# Patient Record
Sex: Female | Born: 1965 | Race: White | Hispanic: No | Smoking: Never smoker
Health system: Southern US, Community
[De-identification: ages and names within clinical notes are randomized; demographics above are authoritative.]

## PROBLEM LIST (undated history)

## (undated) DIAGNOSIS — F32A Depression, unspecified: Secondary | ICD-10-CM

## (undated) DIAGNOSIS — T7840XA Allergy, unspecified, initial encounter: Secondary | ICD-10-CM

## (undated) DIAGNOSIS — F419 Anxiety disorder, unspecified: Secondary | ICD-10-CM

## (undated) DIAGNOSIS — F329 Major depressive disorder, single episode, unspecified: Secondary | ICD-10-CM

## (undated) HISTORY — DX: Depression, unspecified: F32.A

## (undated) HISTORY — DX: Anxiety disorder, unspecified: F41.9

## (undated) HISTORY — PX: APPENDECTOMY: SHX54

## (undated) HISTORY — DX: Allergy, unspecified, initial encounter: T78.40XA

## (undated) HISTORY — DX: Major depressive disorder, single episode, unspecified: F32.9

---

## 2002-03-22 ENCOUNTER — Other Ambulatory Visit: Admission: RE | Admit: 2002-03-22 | Discharge: 2002-03-22 | Payer: Self-pay | Admitting: Internal Medicine

## 2004-07-29 ENCOUNTER — Encounter: Payer: Self-pay | Admitting: Internal Medicine

## 2004-07-29 LAB — CONVERTED CEMR LAB

## 2007-04-24 ENCOUNTER — Ambulatory Visit: Payer: Self-pay | Admitting: Internal Medicine

## 2007-04-24 LAB — CONVERTED CEMR LAB
ALT: 13 units/L (ref 0–35)
AST: 18 units/L (ref 0–37)
Albumin: 4 g/dL (ref 3.5–5.2)
BUN: 14 mg/dL (ref 6–23)
Basophils Relative: 0.1 % (ref 0.0–1.0)
Bilirubin Urine: NEGATIVE
Crystals: NEGATIVE
Eosinophils Absolute: 0.2 10*3/uL (ref 0.0–0.6)
HCT: 36 % (ref 36.0–46.0)
HDL: 52.2 mg/dL (ref >39.0)
Hemoglobin, Urine: NEGATIVE
Hemoglobin: 12.3 g/dL (ref 12.0–15.0)
Neutro Abs: 3.5 10*3/uL (ref 1.4–7.7)
Nitrite: POSITIVE — AB
Platelets: 214 10*3/uL (ref 150–400)
Potassium: 4.5 meq/L (ref 3.5–5.1)
RDW: 13.2 % (ref 11.5–14.6)
Specific Gravity, Urine: 1.02 (ref 1.000–1.03)
TSH: 0.99 microintl units/mL (ref 0.35–5.50)
Total Bilirubin: 1 mg/dL (ref 0.3–1.2)
Total Protein: 7 g/dL (ref 6.0–8.3)
Triglycerides: 59 mg/dL (ref 0–149)

## 2007-05-01 ENCOUNTER — Encounter: Admission: RE | Admit: 2007-05-01 | Discharge: 2007-05-01 | Payer: Self-pay | Admitting: Internal Medicine

## 2007-05-01 ENCOUNTER — Encounter: Payer: Self-pay | Admitting: Internal Medicine

## 2007-05-01 DIAGNOSIS — J309 Allergic rhinitis, unspecified: Secondary | ICD-10-CM | POA: Insufficient documentation

## 2007-05-01 DIAGNOSIS — F329 Major depressive disorder, single episode, unspecified: Secondary | ICD-10-CM | POA: Insufficient documentation

## 2007-05-01 DIAGNOSIS — A6000 Herpesviral infection of urogenital system, unspecified: Secondary | ICD-10-CM | POA: Insufficient documentation

## 2007-05-01 DIAGNOSIS — Z87898 Personal history of other specified conditions: Secondary | ICD-10-CM | POA: Insufficient documentation

## 2007-05-01 DIAGNOSIS — Z9189 Other specified personal risk factors, not elsewhere classified: Secondary | ICD-10-CM | POA: Insufficient documentation

## 2007-05-01 DIAGNOSIS — F411 Generalized anxiety disorder: Secondary | ICD-10-CM | POA: Insufficient documentation

## 2007-05-01 DIAGNOSIS — F3289 Other specified depressive episodes: Secondary | ICD-10-CM | POA: Insufficient documentation

## 2007-05-01 DIAGNOSIS — J3089 Other allergic rhinitis: Secondary | ICD-10-CM | POA: Insufficient documentation

## 2008-01-19 ENCOUNTER — Encounter: Admission: RE | Admit: 2008-01-19 | Discharge: 2008-01-19 | Payer: Self-pay | Admitting: Family Medicine

## 2008-05-11 IMAGING — US US EXTREM LOW NON VASC*L*
1 series · 14 of 19 positions shown · non-contrast
Comparison: none

CLINICAL DATA: Left posterior knee swelling.
 ULTRASOUND OF LEFT LOWER EXTREMITY:

[Series 1: us extrem low non vasc*left* · 0.08mm/px · 14 of 19 slices shown]
[im 1/19]
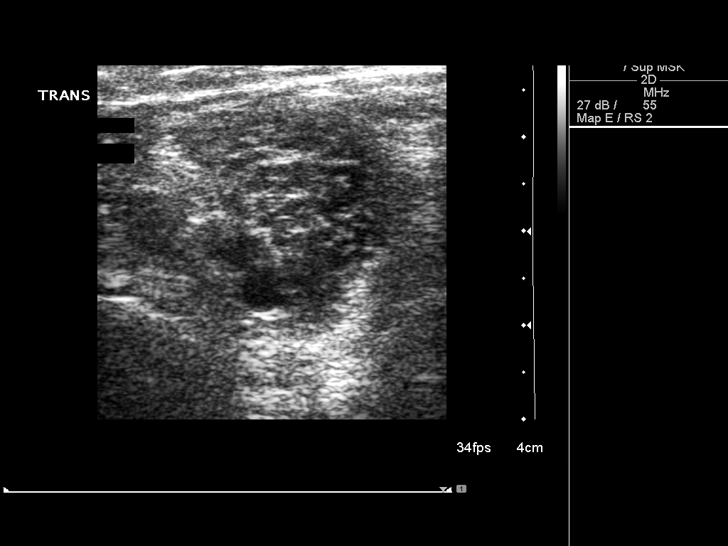
[im 3/19]
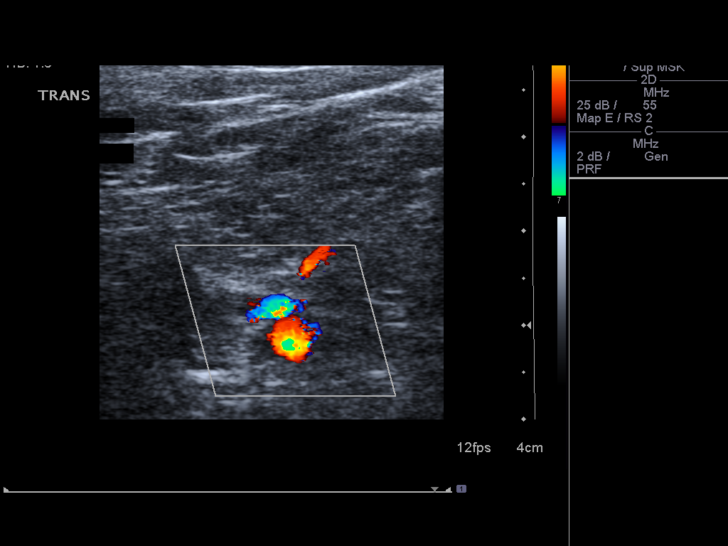
[im 4/19]
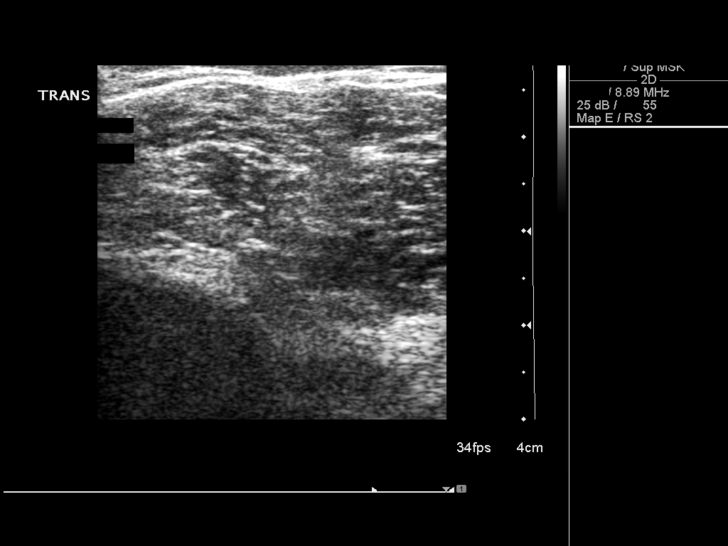
[im 5/19]
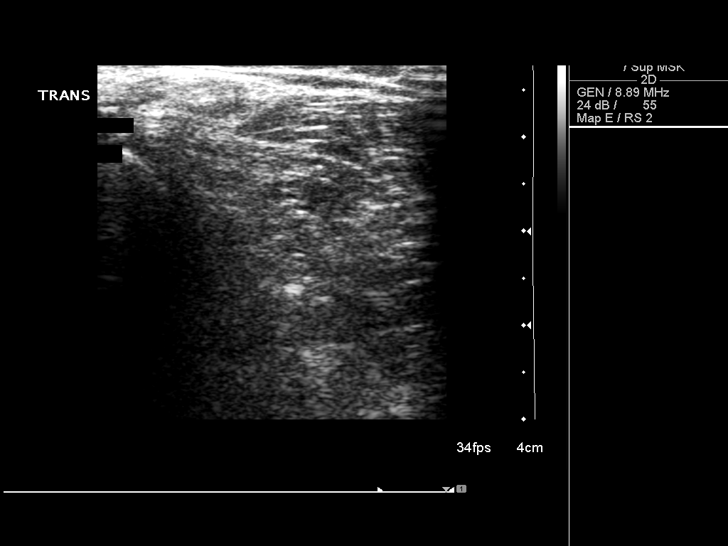
[im 7/19]
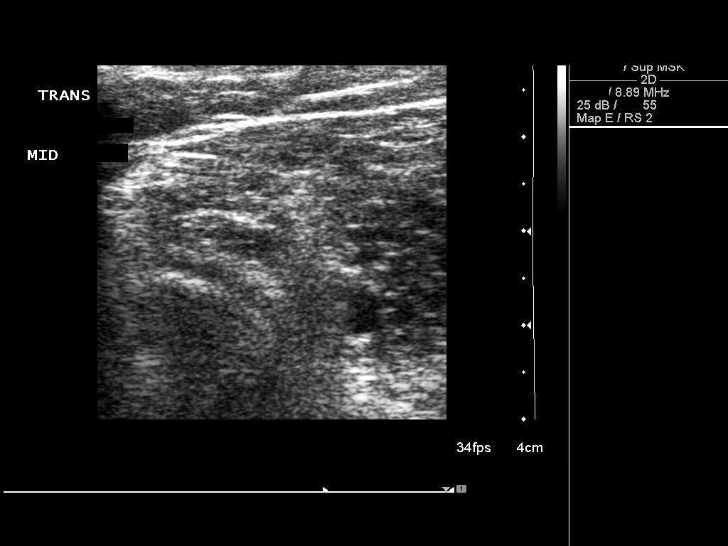
[im 8/19]
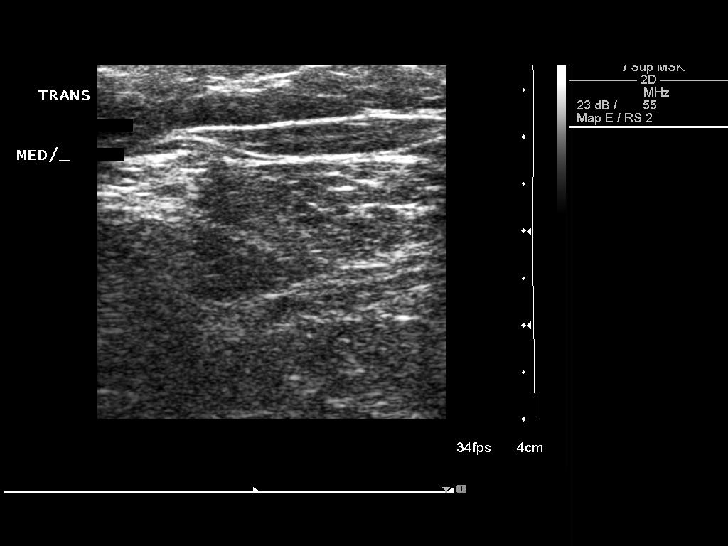
[im 9/19]
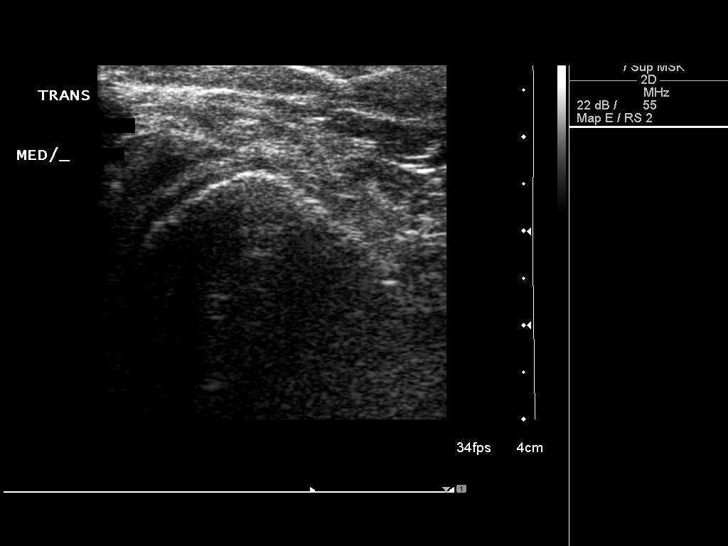
[im 11/19]
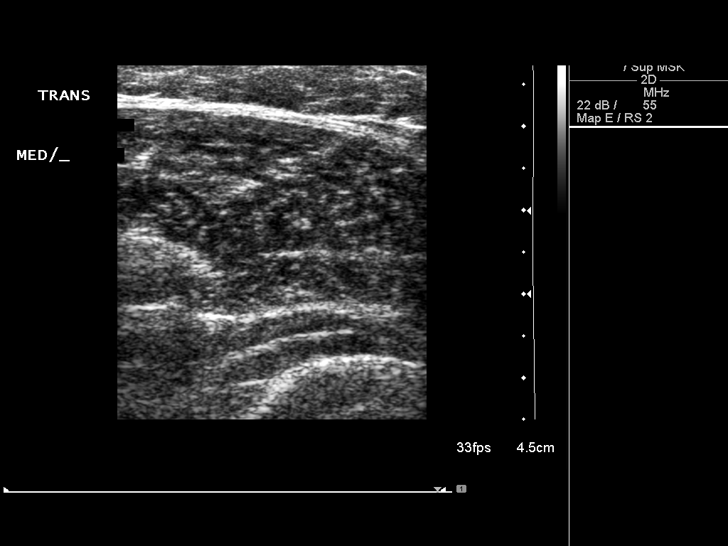
[im 12/19]
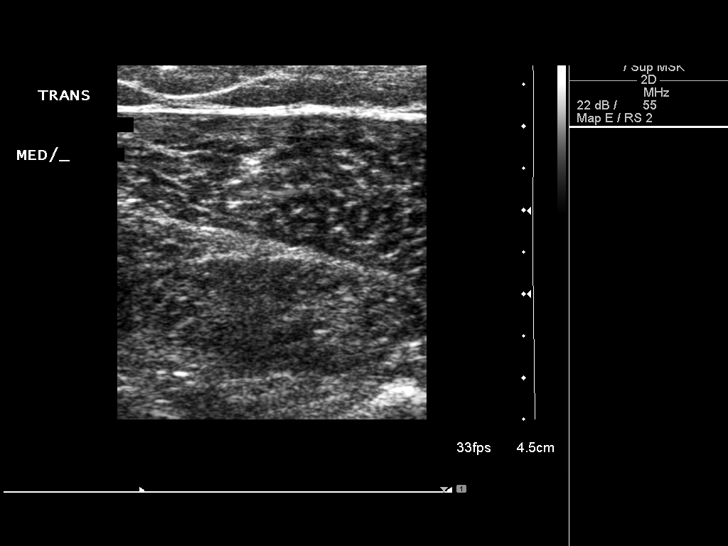
[im 13/19]
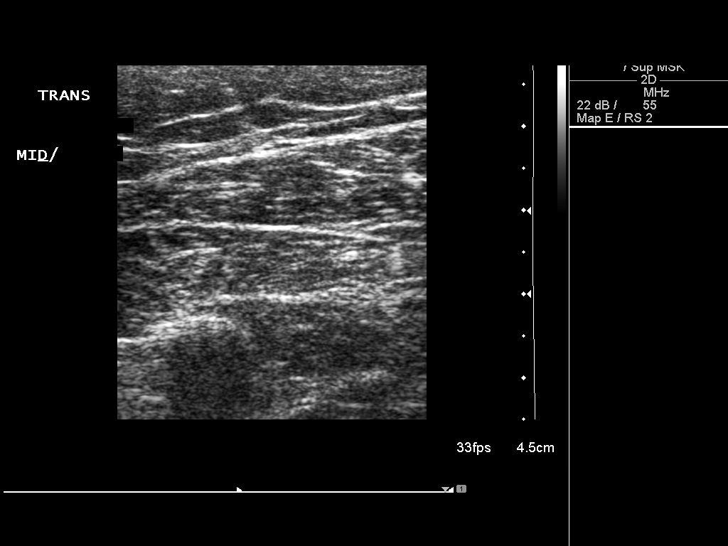
[im 15/19]
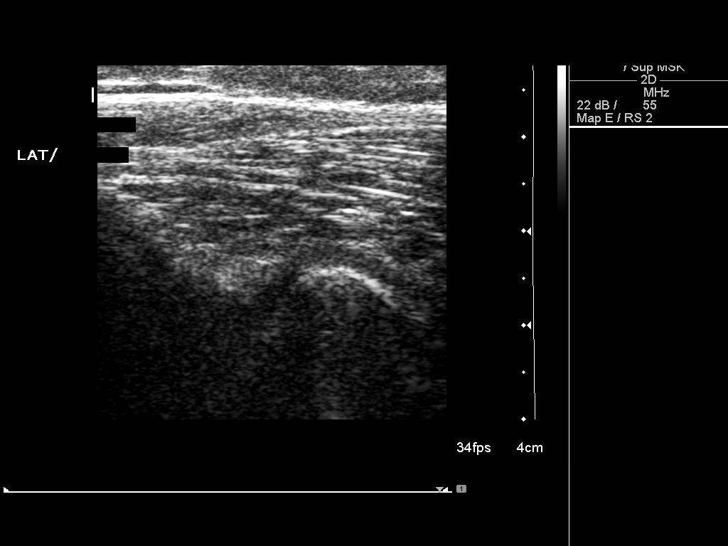
[im 16/19]
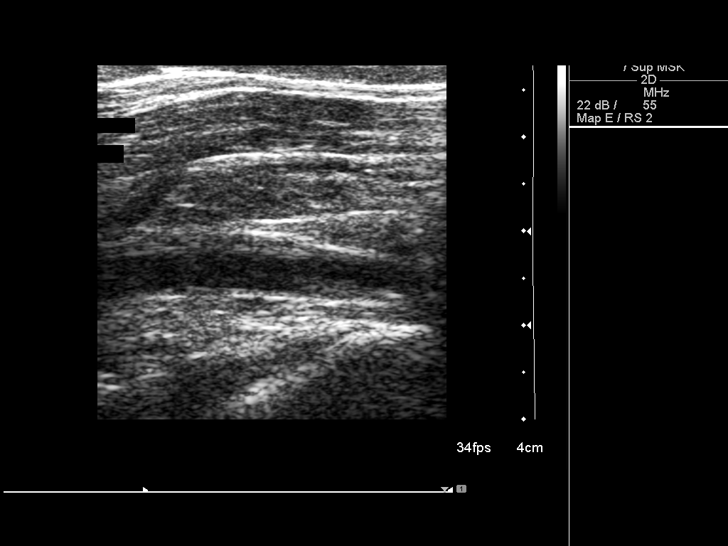
[im 17/19]
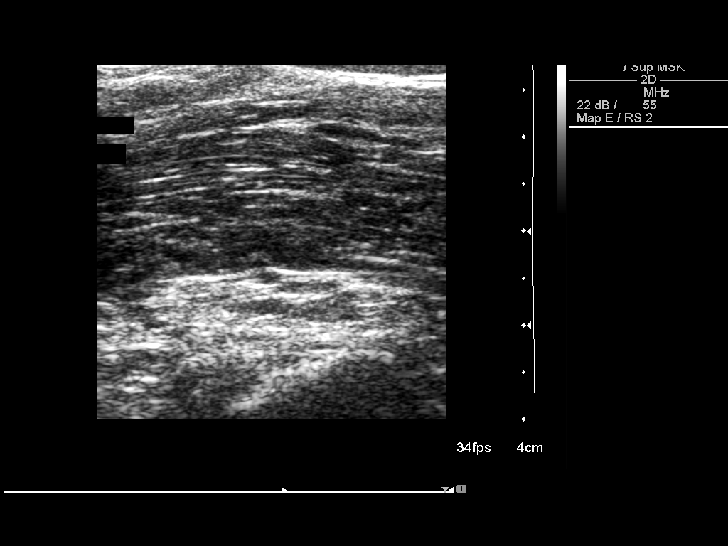
[im 19/19]
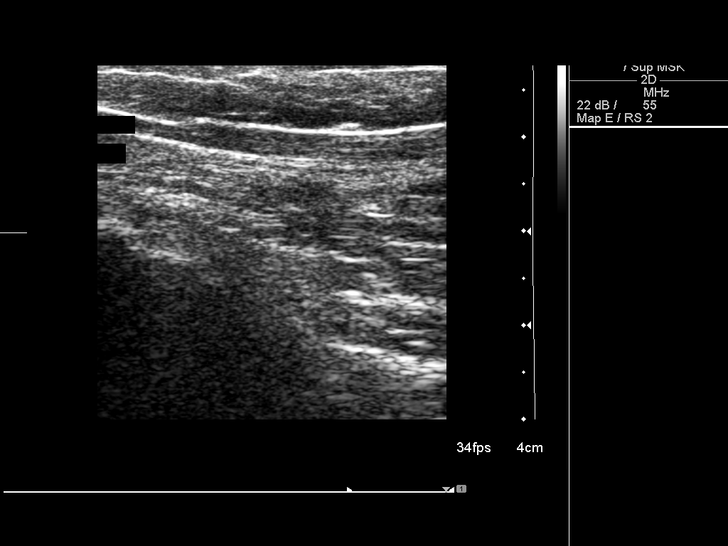

[14 of 19 positions shown; findings below may reference images not displayed]

FINDINGS: No solid or cystic lesion is seen.
IMPRESSION: Negative ultrasound of the left knee for popliteal cyst.

## 2012-05-05 ENCOUNTER — Other Ambulatory Visit: Payer: Self-pay | Admitting: Family Medicine

## 2012-05-05 DIAGNOSIS — Z1231 Encounter for screening mammogram for malignant neoplasm of breast: Secondary | ICD-10-CM

## 2012-06-02 ENCOUNTER — Ambulatory Visit
Admission: RE | Admit: 2012-06-02 | Discharge: 2012-06-02 | Disposition: A | Discharge status: Home / Self Care | Payer: Federal, State, Local not specified - PPO | Source: Ambulatory Visit | Attending: Family Medicine | Admitting: Family Medicine

## 2012-06-02 DIAGNOSIS — Z1231 Encounter for screening mammogram for malignant neoplasm of breast: Secondary | ICD-10-CM

## 2014-01-20 ENCOUNTER — Other Ambulatory Visit: Payer: Self-pay | Admitting: Family Medicine

## 2014-01-20 DIAGNOSIS — Z1231 Encounter for screening mammogram for malignant neoplasm of breast: Secondary | ICD-10-CM

## 2014-02-03 ENCOUNTER — Ambulatory Visit
Admission: RE | Admit: 2014-02-03 | Discharge: 2014-02-03 | Disposition: A | Discharge status: Home / Self Care | Payer: Federal, State, Local not specified - PPO | Source: Ambulatory Visit | Attending: Family Medicine | Admitting: Family Medicine

## 2014-02-03 ENCOUNTER — Encounter (INDEPENDENT_AMBULATORY_CARE_PROVIDER_SITE_OTHER): Payer: Self-pay

## 2014-02-03 DIAGNOSIS — Z1231 Encounter for screening mammogram for malignant neoplasm of breast: Secondary | ICD-10-CM

## 2014-02-04 ENCOUNTER — Other Ambulatory Visit: Payer: Self-pay | Admitting: Family Medicine

## 2014-02-04 DIAGNOSIS — R928 Other abnormal and inconclusive findings on diagnostic imaging of breast: Secondary | ICD-10-CM

## 2014-02-08 ENCOUNTER — Ambulatory Visit (INDEPENDENT_AMBULATORY_CARE_PROVIDER_SITE_OTHER): Payer: Federal, State, Local not specified - PPO | Admitting: Licensed Clinical Social Worker

## 2014-02-08 DIAGNOSIS — F331 Major depressive disorder, recurrent, moderate: Secondary | ICD-10-CM

## 2014-02-17 ENCOUNTER — Ambulatory Visit
Admission: RE | Admit: 2014-02-17 | Discharge: 2014-02-17 | Disposition: A | Discharge status: Home / Self Care | Payer: Federal, State, Local not specified - PPO | Source: Ambulatory Visit | Attending: Family Medicine | Admitting: Family Medicine

## 2014-02-17 ENCOUNTER — Ambulatory Visit (INDEPENDENT_AMBULATORY_CARE_PROVIDER_SITE_OTHER): Payer: Federal, State, Local not specified - PPO | Admitting: Licensed Clinical Social Worker

## 2014-02-17 DIAGNOSIS — R928 Other abnormal and inconclusive findings on diagnostic imaging of breast: Secondary | ICD-10-CM

## 2014-02-17 DIAGNOSIS — F331 Major depressive disorder, recurrent, moderate: Secondary | ICD-10-CM

## 2014-03-03 ENCOUNTER — Ambulatory Visit (INDEPENDENT_AMBULATORY_CARE_PROVIDER_SITE_OTHER): Payer: Federal, State, Local not specified - PPO | Admitting: Licensed Clinical Social Worker

## 2014-03-03 DIAGNOSIS — F331 Major depressive disorder, recurrent, moderate: Secondary | ICD-10-CM

## 2014-04-28 ENCOUNTER — Ambulatory Visit (INDEPENDENT_AMBULATORY_CARE_PROVIDER_SITE_OTHER): Payer: Federal, State, Local not specified - PPO | Admitting: Family Medicine

## 2014-04-28 VITALS — BP 112/60 | HR 62 | Temp 97.6°F | Resp 18

## 2014-04-28 DIAGNOSIS — T63441A Toxic effect of venom of bees, accidental (unintentional), initial encounter: Secondary | ICD-10-CM

## 2014-04-28 DIAGNOSIS — M7989 Other specified soft tissue disorders: Secondary | ICD-10-CM

## 2014-04-28 MED ORDER — METHYLPREDNISOLONE SODIUM SUCC 125 MG IJ SOLR
125.0000 mg | Freq: Once | INTRAMUSCULAR | Status: AC
  Administered 2014-04-28: 125 mg via INTRAMUSCULAR

## 2014-04-28 MED ORDER — EPIPEN 2-PAK 0.3 MG/0.3ML IJ SOAJ: INTRAMUSCULAR | Status: AC

## 2014-04-28 MED ORDER — AMOXICILLIN-POT CLAVULANATE 875-125 MG PO TABS
1.0000 | ORAL_TABLET | Freq: Two times a day (BID) | ORAL | Status: DC
Start: 2014-04-28 — End: 2021-12-12

## 2014-04-28 MED ORDER — PREDNISONE 20 MG PO TABS: ORAL_TABLET | ORAL | Status: DC

## 2014-04-28 NOTE — Progress Notes (Signed)
Subjective: Seen emergently Patient was moving some stuff outdoors yesterday got stung on the right hand by a yellow jacket. She never has had excessive laxatives in the past, but she swelled up. She applied ice. There is erythema to the elbow. She took Zyrtec and took it this morning again. She came on in here because the hand was slightly swollen. No respiratory compromise.  Objective: Severe local reaction to bee sting that was the lower aspect right wrist. The hand is slightly swollen with induration palpable up to just above the elbow. There is some erythema in the antecubital fossa as well as areas of the hand. It does not feel extremely warm to touch though it does feel slightly warm to touch. No axillary nodes. Chest clear. Heart regular.  Assessment: Severe local reaction to bee sting (probably yellow jacket)  Plan: Solu-Medrol 125 Prednisone taper Ice Antihistamine with Zyrtec Due to the erythema decided to cover with antibiotic EpiPen Return if worse

## 2014-04-28 NOTE — Patient Instructions (Addendum)
Take Zyrtec for allergy and itching. Usual doses one pill daily, however I would take a second one tonight and then decrease to the one daily  Take the prednisone beginning tomorrow 3 pills daily for 2 days, then 2 daily for 2 days, then one daily for 2 days. If remarkably better can discontinue after a couple of days  Carry an EpiPen in case of emergency as directed. Read the instructions while you are healthy.  Return if not improving  Augmentin one twice daily for 5 days for possible infection  Bee, Wasp, or Hornet Sting Your caregiver has diagnosed you as having an insect sting. An insect sting appears as a red lump in the skin that sometimes has a tiny hole in the center, or it may have a stinger in the center of the wound. The most common stings are from wasps, hornets and bees. Individuals have different reactions to insect stings.  A normal reaction may cause pain, swelling, and redness around the sting site.  A localized allergic reaction may cause swelling and redness that extends beyond the sting site.  A large local reaction may continue to develop over the next 12 to 36 hours.  On occasion, the reactions can be severe (anaphylactic reaction). An anaphylactic reaction may cause wheezing; difficulty breathing; chest pain; fainting; raised, itchy, red patches on the skin; a sick feeling to your stomach (nausea); vomiting; cramping; or diarrhea. If you have had an anaphylactic reaction to an insect sting in the past, you are more likely to have one again. HOME CARE INSTRUCTIONS   With bee stings, a small sac of poison is left in the wound. Brushing across this with something such as a credit card, or anything similar, will help remove this and decrease the amount of the reaction. This same procedure will not help a wasp sting as they do not leave behind a stinger and poison sac.  Apply a cold compress for 10 to 20 minutes every hour for 1 to 2 days, depending on severity, to reduce  swelling and itching.  To lessen pain, a paste made of water and baking soda may be rubbed on the bite or sting and left on for 5 minutes.  To relieve itching and swelling, you may use take medication or apply medicated creams or lotions as directed.  Only take over-the-counter or prescription medicines for pain, discomfort, or fever as directed by your caregiver.  Wash the sting site daily with soap and water. Apply antibiotic ointment on the sting site as directed.  If you suffered a severe reaction:  If you did not require hospitalization, an adult will need to stay with you for 24 hours in case the symptoms return.  You may need to wear a medical bracelet or necklace stating the allergy.  You and your family need to learn when and how to use an anaphylaxis kit or epinephrine injection.  If you have had a severe reaction before, always carry your anaphylaxis kit with you. SEEK MEDICAL CARE IF:   None of the above helps within 2 to 3 days.  The area becomes red, warm, tender, and swollen beyond the area of the bite or sting.  You have an oral temperature above 102 F (38.9 C). SEEK IMMEDIATE MEDICAL CARE IF:  You have symptoms of an allergic reaction which are:  Wheezing.  Difficulty breathing.  Chest pain.  Lightheadedness or fainting.  Itchy, raised, red patches on the skin.  Nausea, vomiting, cramping or diarrhea. ANY OF  THESE SYMPTOMS MAY REPRESENT A SERIOUS PROBLEM THAT IS AN EMERGENCY. Do not wait to see if the symptoms will go away. Get medical help right away. Call your local emergency services (911 in [place] DO NOT drive yourself to the hospital. MAKE SURE YOU:   Understand these instructions.  Will watch your condition.  Will get help right away if you are not doing well or get worse. Document Released: 07/15/2005 Document Revised: 10/07/2011 Document Reviewed: 12/30/2009 [facility] Patient Information 2015 [place], [place]. This information is not intended  to replace advice given to you by your health care provider. Make sure you discuss any questions you have with your health care provider.

## 2020-12-24 LAB — COLOGUARD: COLOGUARD: NEGATIVE

## 2021-01-10 LAB — HM PAP SMEAR

## 2021-11-30 ENCOUNTER — Telehealth: Payer: Self-pay

## 2021-11-30 NOTE — Telephone Encounter (Signed)
Patient called in and states she use to see Dr. Mardelle Matte a few years ago and moved out of town. Patient would like to see if she would  Dr. Mardelle Matte would be able to take her on as a new patient.  ?

## 2021-12-12 ENCOUNTER — Encounter: Payer: Self-pay | Admitting: Family Medicine

## 2021-12-12 ENCOUNTER — Ambulatory Visit (INDEPENDENT_AMBULATORY_CARE_PROVIDER_SITE_OTHER): Payer: 59 | Admitting: Family Medicine

## 2021-12-12 VITALS — BP 100/64 | HR 63 | Temp 97.9°F | Ht 64.0 in | Wt 135.2 lb

## 2021-12-12 DIAGNOSIS — F411 Generalized anxiety disorder: Secondary | ICD-10-CM

## 2021-12-12 DIAGNOSIS — A6000 Herpesviral infection of urogenital system, unspecified: Secondary | ICD-10-CM | POA: Diagnosis not present

## 2021-12-12 DIAGNOSIS — Z0000 Encounter for general adult medical examination without abnormal findings: Secondary | ICD-10-CM | POA: Diagnosis not present

## 2021-12-12 DIAGNOSIS — R69 Illness, unspecified: Secondary | ICD-10-CM | POA: Diagnosis not present

## 2021-12-12 DIAGNOSIS — F1090 Alcohol use, unspecified, uncomplicated: Secondary | ICD-10-CM

## 2021-12-12 DIAGNOSIS — Z1231 Encounter for screening mammogram for malignant neoplasm of breast: Secondary | ICD-10-CM

## 2021-12-12 DIAGNOSIS — Z1159 Encounter for screening for other viral diseases: Secondary | ICD-10-CM | POA: Diagnosis not present

## 2021-12-12 DIAGNOSIS — Z23 Encounter for immunization: Secondary | ICD-10-CM | POA: Diagnosis not present

## 2021-12-12 DIAGNOSIS — T63441A Toxic effect of venom of bees, accidental (unintentional), initial encounter: Secondary | ICD-10-CM | POA: Insufficient documentation

## 2021-12-12 DIAGNOSIS — J3089 Other allergic rhinitis: Secondary | ICD-10-CM | POA: Diagnosis not present

## 2021-12-12 HISTORY — DX: Alcohol use, unspecified, uncomplicated: F10.90

## 2021-12-12 LAB — LIPID PANEL
Cholesterol: 152 mg/dL (ref 0–200)
HDL: 53.4 mg/dL (ref >39.00)
LDL Cholesterol: 81 mg/dL (ref 0–99)
NonHDL: 98.37
Total CHOL/HDL Ratio: 3
Triglycerides: 88 mg/dL (ref 0.0–149.0)
VLDL: 17.6 mg/dL (ref 0.0–40.0)

## 2021-12-12 LAB — CBC WITH DIFFERENTIAL/PLATELET
Basophils Absolute: 0 10*3/uL (ref 0.0–0.1)
Basophils Relative: 0.8 % (ref 0.0–3.0)
Eosinophils Absolute: 0 10*3/uL (ref 0.0–0.7)
Eosinophils Relative: 1 % (ref 0.0–5.0)
HCT: 39.6 % (ref 36.0–46.0)
Hemoglobin: 12.9 g/dL (ref 12.0–15.0)
Lymphocytes Relative: 24.7 % (ref 12.0–46.0)
Lymphs Abs: 1.1 10*3/uL (ref 0.7–4.0)
MCHC: 32.7 g/dL (ref 30.0–36.0)
MCV: 93.6 fl (ref 78.0–100.0)
Monocytes Absolute: 0.3 10*3/uL (ref 0.1–1.0)
Monocytes Relative: 7.2 % (ref 3.0–12.0)
Neutro Abs: 2.9 10*3/uL (ref 1.4–7.7)
Neutrophils Relative %: 66.3 % (ref 43.0–77.0)
Platelets: 183 10*3/uL (ref 150.0–400.0)
RBC: 4.23 Mil/uL (ref 3.87–5.11)
RDW: 14.5 % (ref 11.5–15.5)
WBC: 4.4 10*3/uL (ref 4.0–10.5)

## 2021-12-12 LAB — COMPREHENSIVE METABOLIC PANEL
ALT: 18 U/L (ref 0–35)
AST: 20 U/L (ref 0–37)
Albumin: 4.3 g/dL (ref 3.5–5.2)
Alkaline Phosphatase: 45 U/L (ref 39–117)
BUN: 14 mg/dL (ref 6–23)
CO2: 23 mEq/L (ref 19–32)
Calcium: 9.2 mg/dL (ref 8.4–10.5)
Chloride: 105 mEq/L (ref 96–112)
Creatinine, Ser: 0.79 mg/dL (ref 0.40–1.20)
GFR: 83.22 mL/min (ref >60.00)
Glucose, Bld: 77 mg/dL (ref 70–99)
Potassium: 4.1 mEq/L (ref 3.5–5.1)
Sodium: 138 mEq/L (ref 135–145)
Total Bilirubin: 0.4 mg/dL (ref 0.2–1.2)
Total Protein: 6.9 g/dL (ref 6.0–8.3)

## 2021-12-12 MED ORDER — VALACYCLOVIR HCL 1 G PO TABS: 1000.0000 mg | ORAL_TABLET | Freq: Every day | ORAL | 3 refills | Status: DC

## 2021-12-12 NOTE — Progress Notes (Signed)
? ?Subjective  ?Chief Complaint  ?Patient presents with  ? Annual Exam  ?  New pt here for CPE  ? ? ?HPI: Isabella Barrett is a 56 y.o. female who presents to [facility] [place] today for a Female Wellness Visit. She also has the concerns and/or needs as listed above in the chief complaint. These will be addressed in addition to the Health Maintenance Visit.  ? ?Wellness Visit: annual visit with health maintenance review and exam without Pap ? ?Pleasant 56 year old divorced single female, mother of 3 grown children returns to [facility] from self for about a month ago.  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  Overall doing well.  Would like to establish care and have annual physical today.  Reviewed old records.  Mammogram, Pap smear and Cologuard screens are all current and up-to-date.  She is eligible for Shingrix. ?Chronic disease f/u and/or acute problem visit: (deemed necessary to be done in addition to the wellness visit): ?Has chronic anxiety that is well controlled on Prozac 40 mg daily.  She has been on this for a number of years.  No adverse effects.  However she does admit to daily alcohol use.  She drinks 1-2 drinks per night to calm her nerves.  No dependency issues or functional issues.  No complications from alcohol that she is aware of ?Genital herpes on Famvir twice daily for prevention.  This was started 3 years ago due to recurrent outbreaks. ? ?Assessment  ?1. Annual physical exam   ?2. GAD (generalized anxiety disorder)   ?3. Perennial allergic rhinitis   ?4. Need for hepatitis C screening test   ?5. Genital herpes simplex, unspecified site   ?6. Encounter for screening mammogram for breast cancer   ?7. Need for shingles vaccine   ?8. Habitual alcohol use   ? ?  ?Plan  ?Female Wellness Visit: ?Age appropriate Health Maintenance and Prevention measures were discussed with patient. Included topics are cancer screening recommendations, ways to keep healthy (see  AVS) including dietary and exercise recommendations, regular eye and dental care, use of seat belts, and avoidance of moderate alcohol use and tobacco use.  Screens are current ?BMI: discussed patient's BMI and encouraged positive lifestyle modifications to help get to or maintain a target BMI. ?HM needs and immunizations were addressed and ordered. See below for orders. See HM and immunization section for updates.  Vaccination counseling given for Shingrix.  First dose given today.  Return in 2 to 6 months for second dose ?Routine labs and screening tests ordered including cmp, cbc and lipids where appropriate. ?Discussed recommendations regarding Vit D and calcium supplementation (see AVS) ? ?Chronic disease management visit and/or acute problem visit: ?Chronic alcohol use: Counseling done.  Recommend decreasing use. ?Anxiety disorder: Continue Prozac 40. ?Recurrent genital herpes prophylaxis: Changed to Valtrex 1000 mg daily. ? ?Follow up: 12 months for complete physical ?Orders Placed This Encounter  ?Procedures  ? MM DIGITAL SCREENING BILATERAL  ? Varicella-zoster vaccine IM (Shingrix)  ? CBC with Differential/Platelet  ? Comp Met (CMET)  ? Lipid Profile  ? Hepatitis C Antibody  ? ?Meds ordered this encounter  ?Medications  ? valACYclovir (VALTREX) 1000 MG tablet  ?  Sig: Take 1 tablet (1,000 mg total) by mouth daily.  ?  Dispense:  90 tablet  ?  Refill:  3  ? ?  ? ?Body mass index is 23.21 kg/m?. ?Wt Readings from Last 3 Encounters:  ?12/12/21 135 lb 3.2 oz (  61.3 kg)  ?04/24/07 127 lb (57.6 kg)  ? ? ? ?Patient Active Problem List  ? Diagnosis Date Noted  ? Anaphylactic reaction to bee sting 12/12/2021  ?  Epi pen ? ?  ? Habitual alcohol use 12/12/2021  ?  Daily, 2 drinks per night.  ? ?  ? GAD (generalized anxiety disorder) 05/01/2007  ?  prozac ? ?  ? Perennial allergic rhinitis 05/01/2007  ? Genital herpes 05/01/2007  ? ?Health Maintenance  ?Topic Date Due  ? Hepatitis C Screening  Never done  ? COVID-19  Vaccine (3 - Pfizer risk series) 12/28/2021 (Originally 12/30/2019)  ? MAMMOGRAM  01/30/2022  ? Zoster Vaccines- Shingrix (2 of 2) 02/06/2022  ? INFLUENZA VACCINE  02/26/2022  ? TETANUS/TDAP  01/21/2024  ? PAP SMEAR-Modifier  01/08/2026  ? HIV Screening  Completed  ? HPV VACCINES  Aged Out  ? Fecal DNA (Cologuard)  Discontinued  ? ?Immunization History  ?Administered Date(s) Administered  ? Influenza, Seasonal, Injecte, Preservative Fre 04/22/2014  ? PFIZER(Purple Top)SARS-COV-2 Vaccination 11/11/2019, 12/02/2019  ? Tdap 01/20/2014  ? Zoster Recombinat (Shingrix) 12/12/2021  ? ?We updated and reviewed the patient's past history in detail and it is documented below. ?Allergies: ?Patient is allergic to bee venom, sulfa antibiotics, and sulfonamide derivatives. ?Past Medical History ?Patient  has a past medical history of Allergy, Anxiety, and Depression. ?Past Surgical History ?Patient  has a past surgical history that includes Appendectomy. ?Family History: ?Patient family history includes Cancer in her father. ?Social History:  ?Patient  reports that she has never smoked. She does not have any smokeless tobacco history on file. She reports that she does not use drugs. ? ?Review of Systems: ?Constitutional: negative for fever or malaise ?Ophthalmic: negative for photophobia, double vision or loss of vision ?Cardiovascular: negative for chest pain, dyspnea on exertion, or new LE swelling ?Respiratory: negative for SOB or persistent cough ?Gastrointestinal: negative for abdominal pain, change in bowel habits or melena ?Genitourinary: negative for dysuria or gross hematuria, no abnormal uterine bleeding or disharge ?Musculoskeletal: negative for new gait disturbance or muscular weakness ?Integumentary: negative for new or persistent rashes, no breast lumps ?Neurological: negative for TIA or stroke symptoms ?Psychiatric: negative for SI or delusions ?Allergic/Immunologic: negative for hives ? ?Patient Care Team  ?   Relationship Specialty Notifications Start End  ?Leamon Arnt, MD PCP - General Family Medicine  12/12/21   ? ? ?Objective  ?Vitals: BP 100/64   Pulse 63   Temp 97.9 ?F (36.6 ?C)   Ht 5' 4"  (1.626 m)   Wt 135 lb 3.2 oz (61.3 kg)   SpO2 95%   BMI 23.21 kg/m?  ?General:  Well developed, well nourished, no acute distress  ?Psych:  Alert and orientedx3,normal mood and affect ?HEENT:  Normocephalic, atraumatic, non-icteric sclera,  supple neck without adenopathy, mass or thyromegaly ?Cardiovascular:  Normal S1, S2, RRR without gallop, rub or murmur ?Respiratory:  Good breath sounds bilaterally, CTAB with normal respiratory effort ?Gastrointestinal: normal bowel sounds, soft, non-tender, no noted masses. No HSM ?MSK: no deformities, contusions. Joints are without erythema or swelling.  ?Skin:  Warm, no rashes or suspicious lesions noted ?Neurologic:    Mental status is normal. CN 2-11 are normal. Gross motor and sensory exams are normal. Normal gait. No tremor ? ? ?Commons side effects, risks, benefits, and alternatives for medications and treatment plan prescribed today were discussed, and the patient expressed understanding of the given instructions. Patient is instructed to call or message via MyChart  if he/she has any questions or concerns regarding our treatment plan. No barriers to understanding were identified. We discussed Red Flag symptoms and signs in detail. Patient expressed understanding regarding what to do in case of urgent or emergency type symptoms.  ?Medication list was reconciled, printed and provided to the patient in AVS. Patient instructions and summary information was reviewed with the patient as documented in the AVS. ?This note was prepared with assistance of Systems analyst. Occasional wrong-word or sound-a-like substitutions may have occurred due to the inherent limitations of voice recognition software ? ?This visit occurred during the SARS-CoV-2 public health  emergency.  Safety protocols were in place, including screening questions prior to the visit, additional usage of staff PPE, and extensive cleaning of exam room while observing appropriate contact time as indicated for disinf

## 2021-12-12 NOTE — Patient Instructions (Addendum)
It was so good seeing you again! Thank you for establishing with my new practice and allowing me to continue caring for you. It means a lot to me.  ? ?Please schedule a follow up appointment with me in 12 months for your annual complete physical; please come fasting. Please schedule a nurse visit in 2-6 months to receive your 2nd shingrix vaccination. You were given your first today. ? ?I will release your lab results to you on your MyChart account with further instructions. You may see the results before I do, but when I review them I will send you a message with my report or have my assistant call you if things need to be discussed. Please reply to my message with any questions. Thank you!  ? ?I have ordered a mammogram and/or bone density for you as we discussed today: this is due in July 2023 ?[x]   Mammogram  ?[]   Bone Density ? ?Please call the office checked below to schedule your appointment: ? ?[x]   [facility]     ? [address]      ? [place],       ? [phone]        ? ?[]   [facility] ? [address] [place]  ? [place], [facility] ? [phone] ? ?Please do these things to maintain good health! ? ?Exercise at least 30-45 minutes a day,  4-5 days a week.  ?Eat a low-fat diet with lots of fruits and vegetables, up to 7-9 servings per day. ?Drink plenty of water daily. Try to drink 8 8oz glasses per day. ?Seatbelts can save your life. Always wear your seatbelt. ?Place Smoke Detectors on every level of your home and check batteries every year. ?Schedule an appointment with an eye doctor for an eye exam every 1-2 years ?Safe sex - use condoms to protect yourself from STDs if you could be exposed to these types of infections. Use birth control if you do not want to become pregnant and are sexually active. ?Avoid heavy alcohol use. If you drink, keep it to less than 2 drinks/day and not every day. ?Health Care Power of Attorney.  Choose someone you trust that could  speak for you if you became unable to speak for yourself. ?Depression is common in our stressful world.If you're feeling down or losing interest in things you normally enjoy, please come in for a visit. ?If anyone is threatening or hurting you, please get help. Physical or Emotional Violence is never OK.   ?  ? ?

## 2022-02-14 ENCOUNTER — Ambulatory Visit (INDEPENDENT_AMBULATORY_CARE_PROVIDER_SITE_OTHER): Payer: 59 | Admitting: *Deleted

## 2022-02-14 DIAGNOSIS — Z23 Encounter for immunization: Secondary | ICD-10-CM | POA: Diagnosis not present

## 2022-02-14 NOTE — Progress Notes (Signed)
Per orders of Dr. Mardelle Matte, injection of 2nd Shingles Vaccine given in left deltoid per patient preference by Jobe Gibbon, CMA. Patient tolerated injection well.

## 2022-03-14 ENCOUNTER — Other Ambulatory Visit: Payer: Self-pay | Admitting: Family Medicine

## 2022-03-15 ENCOUNTER — Other Ambulatory Visit (HOSPITAL_COMMUNITY): Payer: Self-pay

## 2022-03-15 MED ORDER — FLUOXETINE HCL 40 MG PO CAPS
40.0000 mg | ORAL_CAPSULE | Freq: Every day | ORAL | 3 refills | Status: DC
  Filled 2022-03-15: qty 30, 30d supply, fill #0
  Filled 2022-04-13: qty 30, 30d supply, fill #1
  Filled 2022-05-11: qty 30, 30d supply, fill #2
  Filled 2022-06-10: qty 30, 30d supply, fill #3
  Filled 2022-07-09: qty 30, 30d supply, fill #4
  Filled 2022-08-07: qty 30, 30d supply, fill #5
  Filled 2022-09-05: qty 30, 30d supply, fill #6
  Filled 2022-10-23: qty 30, 30d supply, fill #7
  Filled 2022-11-18: qty 30, 30d supply, fill #8
  Filled 2022-12-18: qty 30, 30d supply, fill #9
  Filled 2023-01-22: qty 30, 30d supply, fill #10
  Filled 2023-02-19: qty 30, 30d supply, fill #11

## 2022-04-13 ENCOUNTER — Other Ambulatory Visit (HOSPITAL_COMMUNITY): Payer: Self-pay

## 2022-04-22 ENCOUNTER — Encounter: Payer: Self-pay | Admitting: *Deleted

## 2022-05-13 ENCOUNTER — Other Ambulatory Visit (HOSPITAL_COMMUNITY): Payer: Self-pay

## 2022-06-10 ENCOUNTER — Other Ambulatory Visit (HOSPITAL_COMMUNITY): Payer: Self-pay

## 2022-07-10 ENCOUNTER — Other Ambulatory Visit (HOSPITAL_COMMUNITY): Payer: Self-pay

## 2022-07-11 ENCOUNTER — Encounter: Payer: Self-pay | Admitting: *Deleted

## 2022-07-12 ENCOUNTER — Telehealth: Payer: 59 | Admitting: Family Medicine

## 2022-07-12 DIAGNOSIS — M5441 Lumbago with sciatica, right side: Secondary | ICD-10-CM

## 2022-07-12 MED ORDER — NAPROXEN 500 MG PO TABS: 500.0000 mg | ORAL_TABLET | Freq: Two times a day (BID) | ORAL | 0 refills | Status: AC

## 2022-07-12 MED ORDER — BACLOFEN 10 MG PO TABS: 10.0000 mg | ORAL_TABLET | Freq: Three times a day (TID) | ORAL | 0 refills | Status: DC

## 2022-07-12 NOTE — Progress Notes (Signed)

## 2022-07-26 ENCOUNTER — Telehealth: Payer: 59 | Admitting: Physician Assistant

## 2022-07-26 DIAGNOSIS — J069 Acute upper respiratory infection, unspecified: Secondary | ICD-10-CM

## 2022-07-26 MED ORDER — PROMETHAZINE-DM 6.25-15 MG/5ML PO SYRP: 5.0000 mL | ORAL_SOLUTION | Freq: Four times a day (QID) | ORAL | 0 refills | Status: DC | PRN

## 2022-07-26 MED ORDER — FLUTICASONE PROPIONATE 50 MCG/ACT NA SUSP: 2.0000 | Freq: Every day | NASAL | 0 refills | Status: DC

## 2022-07-26 MED ORDER — BENZONATATE 100 MG PO CAPS: 100.0000 mg | ORAL_CAPSULE | Freq: Three times a day (TID) | ORAL | 0 refills | Status: DC | PRN

## 2022-07-26 NOTE — Progress Notes (Signed)
E-Visit for Upper Respiratory Infection   We are sorry you are not feeling well.  Here is how we plan to help!  Based on what you have shared with me, it looks like you may have a viral upper respiratory infection.  Upper respiratory infections are caused by a large number of viruses; however, rhinovirus is the most common cause.   Symptoms vary from person to person, with common symptoms including sore throat, cough, fatigue or lack of energy and feeling of general discomfort.  A low-grade fever of up to 100.4 may present, but is often uncommon.  Symptoms vary however, and are closely related to a person's age or underlying illnesses.  The most common symptoms associated with an upper respiratory infection are nasal discharge or congestion, cough, sneezing, headache and pressure in the ears and face.  These symptoms usually persist for about 3 to 10 days, but can last up to 2 weeks.  It is important to know that upper respiratory infections do not cause serious illness or complications in most cases.    Upper respiratory infections can be transmitted from person to person, with the most common method of transmission being a person's hands.  The virus is able to live on the skin and can infect other persons for up to 2 hours after direct contact.  Also, these can be transmitted when someone coughs or sneezes; thus, it is important to cover the mouth to reduce this risk.  To keep the spread of the illness at bay, good hand hygiene is very important.  This is an infection that is most likely caused by a virus. There are no specific treatments other than to help you with the symptoms until the infection runs its course.  We are sorry you are not feeling well.  Here is how we plan to help!   For nasal congestion, you may use an oral decongestants such as Mucinex D or if you have glaucoma or high blood pressure use plain Mucinex.  Saline nasal spray or nasal drops can help and can safely be used as often as  needed for congestion.  For your congestion, I have prescribed Fluticasone nasal spray one spray in each nostril twice a day  If you do not have a history of heart disease, hypertension, diabetes or thyroid disease, prostate/bladder issues or glaucoma, you may also use Sudafed to treat nasal congestion.  It is highly recommended that you consult with a pharmacist or your primary care physician to ensure this medication is safe for you to take.     If you have a cough, you may use cough suppressants such as Delsym and Robitussin.  If you have glaucoma or high blood pressure, you can also use Coricidin HBP.   For cough I have prescribed for you A prescription cough medication called Tessalon Perles 100 mg. You may take 1-2 capsules every 8 hours as needed for cough and Promethazine DM cough syrup Take 5mL every 6 hours as needed for cough  If you have a sore or scratchy throat, use a saltwater gargle-  to  teaspoon of salt dissolved in a 4-ounce to 8-ounce glass of warm water.  Gargle the solution for approximately 15-30 seconds and then spit.  It is important not to swallow the solution.  You can also use throat lozenges/cough drops and Chloraseptic spray to help with throat pain or discomfort.  Warm or cold liquids can also be helpful in relieving throat pain.  For headache, pain or general   or general discomfort, you can use Ibuprofen or Tylenol as directed.   Some authorities believe that zinc sprays or the use of Echinacea may shorten the course of your symptoms.   HOME CARE Only take medications as instructed by your medical team. Be sure to drink plenty of fluids. Water is fine as well as fruit juices, sodas and electrolyte beverages. You may want to stay away from caffeine or alcohol. If you are nauseated, try taking small sips of liquids. How do you know if you are getting enough fluid? Your urine should be a pale yellow or almost colorless. Get rest. Taking a steamy shower or using a humidifier  may help nasal congestion and ease sore throat pain. You can place a towel over your head and breathe in the steam from hot water coming from a faucet. Using a saline nasal spray works much the same way. Cough drops, hard candies and sore throat lozenges may ease your cough. Avoid close contacts especially the very young and the elderly Cover your mouth if you cough or sneeze Always remember to wash your hands.   GET HELP RIGHT AWAY IF: You develop worsening fever. If your symptoms do not improve within 10 days You develop yellow or green discharge from your nose over 3 days. You have coughing fits You develop a severe head ache or visual changes. You develop shortness of breath, difficulty breathing or start having chest pain Your symptoms persist after you have completed your treatment plan  MAKE SURE YOU  Understand these instructions. Will watch your condition. Will get help right away if you are not doing well or get worse.  Thank you for choosing an e-visit.  Your e-visit answers were reviewed by a board certified advanced clinical practitioner to complete your personal care plan. Depending upon the condition, your plan could have included both over the counter or prescription medications.  Please review your pharmacy choice. Make sure the pharmacy is open so you can pick up prescription now. If there is a problem, you may contact your provider through [employer] and have the prescription routed to another pharmacy.  Your safety is important to [place]. If you have drug allergies check your prescription carefully.   For the next 24 hours you can use MyChart to ask questions about today's visit, request a non-urgent call back, or ask for a work or school excuse. You will get an email in the next two days asking about your experience. I hope that your e-visit has been valuable and will speed your recovery.   I have spent 5 minutes in review of e-visit questionnaire, review and  updating patient chart, medical decision making and response to patient.   Margaretann Loveless, PA-C

## 2022-08-05 ENCOUNTER — Ambulatory Visit (INDEPENDENT_AMBULATORY_CARE_PROVIDER_SITE_OTHER): Payer: 59 | Admitting: Family Medicine

## 2022-08-05 ENCOUNTER — Encounter: Payer: Self-pay | Admitting: Family Medicine

## 2022-08-05 VITALS — BP 108/60 | HR 71 | Temp 98.4°F | Ht 64.0 in | Wt 127.8 lb

## 2022-08-05 DIAGNOSIS — J209 Acute bronchitis, unspecified: Secondary | ICD-10-CM

## 2022-08-05 DIAGNOSIS — R051 Acute cough: Secondary | ICD-10-CM | POA: Diagnosis not present

## 2022-08-05 DIAGNOSIS — M5431 Sciatica, right side: Secondary | ICD-10-CM

## 2022-08-05 LAB — POCT INFLUENZA A/B
Influenza A, POC: NEGATIVE
Influenza B, POC: NEGATIVE

## 2022-08-05 LAB — POC COVID19 BINAXNOW: SARS Coronavirus 2 Ag: NEGATIVE

## 2022-08-05 MED ORDER — PREDNISONE 10 MG PO TABS: ORAL_TABLET | ORAL | 0 refills | Status: DC

## 2022-08-05 MED ORDER — GABAPENTIN 300 MG PO CAPS: 300.0000 mg | ORAL_CAPSULE | Freq: Every day | ORAL | 0 refills | Status: DC

## 2022-08-05 MED ORDER — AZITHROMYCIN 250 MG PO TABS: ORAL_TABLET | ORAL | 0 refills | Status: DC

## 2022-08-05 NOTE — Patient Instructions (Signed)
Please follow up as scheduled for your next visit with me: 12/16/2022   If you have any questions or concerns, please don't hesitate to send me a message via MyChart or call the office at [phone]. Thank you for visiting with [place] today! It's our pleasure caring for you.   Sciatica  Sciatica is pain, numbness, weakness, or tingling along the path of the sciatic nerve. The sciatic nerve starts in the lower back and runs down the back of each leg. The nerve controls the muscles in the lower leg and in the back of the knee. It also provides feeling (sensation) to the back of the thigh, the lower leg, and the sole of the foot. Sciatica is a symptom of another medical condition that pinches or puts pressure on the sciatic nerve. Sciatica most often only affects one side of the body. Sciatica usually goes away on its own or with treatment. In some cases, sciatica may come back (recur). What are the causes? This condition is caused by pressure on the sciatic nerve or pinching of the nerve. This may be the result of: A disk in between the bones of the spine bulging out too far (herniated disk). Age-related changes in the spinal disks. A pain disorder that affects a muscle in the buttock. Extra bone growth near the sciatic nerve. A break (fracture) of the pelvis. Pregnancy. Tumor. This is rare. What increases the risk? The following factors may make you more likely to develop this condition: Playing sports that place pressure or stress on the spine. Having poor strength and flexibility. A history of back injury or surgery. Sitting for long periods of time. Doing activities that involve repetitive bending or lifting. Obesity. What are the signs or symptoms? Symptoms can vary from mild to very severe. They may include: Any of the following problems in the lower back, leg, hip, or buttock: Mild tingling, numbness, or dull aches. Burning sensations. Sharp pains. Numbness in the back of the calf or  the sole of the foot. Leg weakness. Severe back pain that makes movement difficult. Symptoms may get worse when you cough, sneeze, or laugh, or when you sit or stand for long periods of time. How is this diagnosed? This condition may be diagnosed based on: Your symptoms and medical history. A physical exam. Blood tests. Imaging tests, such as: X-rays. An MRI. A CT scan. How is this treated? In many cases, this condition improves on its own without treatment. However, treatment may include: Reducing or modifying physical activity. Exercising, including strengthening and stretching. Icing and applying heat to the affected area. Medicines that help to: Relieve pain and swelling. Relax your muscles. Injections of medicines that help to relieve pain and inflammation (steroids) around the sciatic nerve. Surgery. Follow these instructions at home: Medicines Take over-the-counter and prescription medicines only as told by your health care provider. Ask your health care provider if the medicine prescribed to you requires you to avoid driving or using heavy machinery. Managing pain     If directed, put ice on the affected area. To do this: Put ice in a plastic bag. Place a towel between your skin and the bag. Leave the ice on for 20 minutes, 2-3 times a day. If your skin turns bright red, remove the ice right away to prevent skin damage. The risk of skin damage is higher if you cannot feel pain, heat, or cold. If directed, apply heat to the affected area as often as told by your health care provider.  Use the heat source that your health care provider recommends, such as a moist heat pack or a heating pad. Place a towel between your skin and the heat source. Leave the heat on for 20-30 minutes. If your skin turns bright red, remove the heat right away to prevent burns. The risk of burns is higher if you cannot feel pain, heat, or cold. Activity  Return to your normal activities as told  by your health care provider. Ask your health care provider what activities are safe for you. Avoid activities that make your symptoms worse. Take brief periods of rest throughout the day. When you rest for longer periods, mix in some mild activity or stretching between periods of rest. This will help to prevent stiffness and pain. Avoid sitting for long periods of time without moving. Get up and move around at least one time each hour. Exercise and stretch regularly as told by your health care provider. Do not lift anything that is heavier than 10 lb (4.5 kg) until your health care provider says that it is safe. When you do not have symptoms, you should still avoid heavy lifting, especially repetitive heavy lifting. When you lift objects, always use proper lifting technique, which includes: Bending your knees. Keeping the load close to your body. Avoiding twisting. General instructions Maintain a healthy weight. Excess weight puts extra stress on your back. Wear supportive, comfortable shoes. Avoid wearing high heels. Avoid sleeping on a mattress that is too soft or too hard. A mattress that is firm enough to support your back when you sleep may help to reduce your pain. Contact a health care provider if: Your pain is not controlled by medicine. Your pain does not improve or gets worse. Your pain lasts longer than 4 weeks. You have unexplained weight loss. Get help right away if: You are not able to control when you urinate or have bowel movements (incontinence). You have: Weakness in your lower back, pelvis, buttocks, or legs that gets worse. Redness or swelling of your back. A burning sensation when you urinate. Summary Sciatica is pain, numbness, weakness, or tingling along the path of the sciatic nerve, which may include the lower back, legs, hips, and buttocks. This condition is caused by pressure on the sciatic nerve or pinching of the nerve. Treatment often includes rest,  exercise, medicines, and applying ice or heat. This information is not intended to replace advice given to you by your health care provider. Make sure you discuss any questions you have with your health care provider. Document Revised: 10/22/2021 Document Reviewed: 10/22/2021 Elsevier Patient Education  2023 [employer].

## 2022-08-05 NOTE — Progress Notes (Signed)
Subjective  CC:  Chief Complaint  Patient presents with   Leg Pain    Pt stated that she has some Rt side leg pain for the past 6 weeks.    Cough    HPI: SUBJECTIVE:  URI: reviewed evisit from 12/29; treated with cough meds. Still with sxs: congestion, nasal blockage, post nasal drip, cough described as productive and feels tight in the chest with some sob.   denies sinus, high fevers, chest pain or significant GI symptoms. Symptoms have been present for almost 2 weeks. She denies a history of anorexia, dizziness, vomiting .Marland Kitchen She denies a history of asthma or COPD. Patient does not smoke cigarettes. Main reason for visit: 6 week h/o right sided low back pain associated with numbness in her right foot with walking relieved with sitting. Treated with advil and baclofen w/o relief. No weakness in the leg. No bowel or bladder dysfunction. No worsening pain but persistent sxs. No h/o low back problems. No injury.   Assessment  1. Right sided sciatica   2. Acute cough   3. Bronchitis with bronchospasm      Plan  Discussion: Sciatica vs HNP right w/ radiculopathy: education given. Pred taper and gabapentin. If not improving, will warrant MRI> no red flags on history or exam today.  Bronchitis with wheezing: pred and zpak.  Treat for bacterial bronchitis due to prolonged course and worsening symptoms. Education regarding differences between viral and bacterial infections and treatment options are discussed.  Supportive care measures are recommended.  We discussed the use of mucolytic's, decongestants, antihistamines and antitussives as needed.  Tylenol or Advil are recommended if needed.  Follow up: as needed, if not improving   Orders Placed This Encounter  Procedures   POC COVID-19   POCT Influenza A/B   Meds ordered this encounter  Medications   azithromycin (ZITHROMAX) 250 MG tablet    Sig: Take 2 tabs today, then 1 tab daily for 4 days    Dispense:  1 each    Refill:  0    gabapentin (NEURONTIN) 300 MG capsule    Sig: Take 1 capsule (300 mg total) by mouth at bedtime.    Dispense:  90 capsule    Refill:  0   predniSONE (DELTASONE) 10 MG tablet    Sig: Take 4 tabs qd x 2 days, 3 qd x 2 days, 2 qd x 2d, 1qd x 3 days    Dispense:  21 tablet    Refill:  0      I reviewed the patients updated PMH, FH, and SocHx.  Social History: Patient  reports that she has never smoked. She does not have any smokeless tobacco history on file. She reports that she does not use drugs.  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   benzonatate (TESSALON) 100 MG capsule Take 1 capsule (100 mg total) by mouth 3 (three) times daily as needed.  EPIPEN 2-PAK 0.3 MG/0.3ML SOAJ injection Use in the event of a bee sting allergy.   FLUoxetine (PROZAC) 40 MG capsule Take 1 capsule (40 mg total) by mouth daily.   fluticasone (FLONASE) 50 MCG/ACT nasal spray Place 2 sprays into both nostrils daily.   gabapentin (NEURONTIN) 300 MG capsule Take 1 capsule (300 mg total) by mouth at bedtime.   predniSONE (DELTASONE) 10 MG tablet Take 4 tabs qd x 2 days, 3 qd x 2 days, 2 qd x 2d, 1qd x 3 days   promethazine-dextromethorphan (PROMETHAZINE-DM) 6.25-15 MG/5ML syrup Take 5 mLs by mouth 4 (four) times daily as needed.   valACYclovir (VALTREX) 1000 MG tablet Take 1 tablet (1,000 mg total) by mouth daily.     Objective  Vitals: BP 108/60   Pulse 71   Temp 98.4 F (36.9 C)   Ht 5\' 4"  (1.626 m)   Wt 127 lb 12.8 oz (58 kg)   SpO2 93%   BMI 21.94 kg/m  General: no acute distress  Psych:  Alert and oriented, normal mood and affect HEENT:  Normocephalic, atraumatic, supple neck, moist mucous membranes, mildly erythematous pharynx without exudate, mild lymphadenopathy, supple neck Cardiovascular:  RRR without murmur. no edema Respiratory:  Good breath sounds bilaterally, however exp wheezing throughout w/o rales, some rhonchi Skin:  Warm, no rashes Neurologic:   Mental status is normal. normal gait Neg SLR bilaterall. Nl strength throughout bilateral lower extremities, +3brisk bilateral reflexes. No si joint ttp but + right sciatic notch ttp right  Office Visit on 08/05/2022  Component Date Value Ref Range Status   SARS Coronavirus 2 Ag 08/05/2022 Negative  Negative Final   Influenza A, POC 08/05/2022 Negative  Negative Final   Influenza B, POC 08/05/2022 Negative  Negative Final    Commons side effects, risks, benefits, and alternatives for medications and treatment plan prescribed today were discussed, and the patient expressed understanding of the given instructions. Patient is instructed to call or message via MyChart if he/she has any questions or concerns regarding our treatment plan. No barriers to understanding were identified. We discussed Red Flag symptoms and signs in detail. Patient expressed understanding regarding what to do in case of urgent or emergency type symptoms.  Medication list was reconciled, printed and provided to the patient in AVS. Patient instructions and summary information was reviewed with the patient as documented in the AVS. This note was prepared with assistance of Dragon voice recognition software. Occasional wrong-word or sound-a-like substitutions may have occurred due to the inherent limitations of voice recognition software

## 2022-08-13 ENCOUNTER — Ambulatory Visit: Payer: 59 | Admitting: Family Medicine

## 2022-08-26 ENCOUNTER — Encounter: Payer: Self-pay | Admitting: Family Medicine

## 2022-08-28 ENCOUNTER — Ambulatory Visit (INDEPENDENT_AMBULATORY_CARE_PROVIDER_SITE_OTHER): Payer: 59 | Admitting: Family Medicine

## 2022-08-28 ENCOUNTER — Encounter: Payer: Self-pay | Admitting: Family Medicine

## 2022-08-28 VITALS — BP 102/60 | HR 58 | Temp 98.2°F | Ht 64.0 in | Wt 128.2 lb

## 2022-08-28 DIAGNOSIS — J209 Acute bronchitis, unspecified: Secondary | ICD-10-CM | POA: Diagnosis not present

## 2022-08-28 DIAGNOSIS — M5431 Sciatica, right side: Secondary | ICD-10-CM | POA: Diagnosis not present

## 2022-08-28 MED ORDER — GABAPENTIN 300 MG PO CAPS: 300.0000 mg | ORAL_CAPSULE | Freq: Two times a day (BID) | ORAL | 0 refills | Status: DC

## 2022-08-28 MED ORDER — FLUTICASONE-SALMETEROL 100-50 MCG/ACT IN AEPB: 1.0000 | INHALATION_SPRAY | Freq: Two times a day (BID) | RESPIRATORY_TRACT | 3 refills | Status: DC

## 2022-08-28 NOTE — Progress Notes (Signed)
Subjective  CC:  Chief Complaint  Patient presents with   Cough    Pt stated that she has been coughing since the day after Christmas. It had seemed to have gotten better but then it turned around     HPI: Isabella Barrett is a 57 y.o. female who presents to the office today to address the problems listed above in the chief complaint. Bronchitis: See last 2 office visit notes.  Treated for bronchitis, supportive care with cough medicines and then treated with antibiotics and prednisone.  Unfortunately, still with hacking cough.  No true wheezing or shortness of breath.  No more fevers.  Still productive at times.  No chest pain.  Prednisone and Z-Pak did help but never completely resolved.  No new fevers, sore throat.  Does have some allergy symptoms.  Reports in past she did have a mold allergy but caused chronic cough but that resolved when she moved. Right-sided low back pain persists, almost 2 months now.  Reports right-sided low back pain and buttock pain that starts after 5 to 10 minutes of standing or walking.  Pain has shooting symptoms down the right leg to ankle.  No associated weakness.  She reports that bending forward or bending down will help relieve symptoms as well as resting.  No weakness in the lower extremities.  Prednisone taper nor nighttime gabapentin have helped the symptoms at all.  No recent trauma.  No chronic history of back pain.  Assessment  1. Right sided sciatica   2. Bronchitis with bronchospasm      Plan  Right-sided sciatica versus lumbar radiculopathy versus lumbar stenosis: Persistent symptoms associated with standing and better with leaning forward.  Recommend x-rays.  If unremarkable then knee MRI to further help delineate cause.  Education given.  Increase gabapentin to twice daily. Bronchitis with bronchospasm and persistent cough: Trial of Advair twice daily and starting Zyrtec.  Education given  Follow up: As scheduled 12/16/2022  Orders Placed This  Encounter  Procedures   DG Lumbar Spine Complete   MR Lumbar Spine Wo Contrast   Meds ordered this encounter  Medications   fluticasone-salmeterol (ADVAIR) 100-50 MCG/ACT AEPB    Sig: Inhale 1 puff into the lungs 2 (two) times daily.    Dispense:  1 each    Refill:  3   gabapentin (NEURONTIN) 300 MG capsule    Sig: Take 1 capsule (300 mg total) by mouth 2 (two) times daily.    Dispense:  90 capsule    Refill:  0      I reviewed the patients updated PMH, FH, and SocHx.    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   benzonatate (TESSALON) 100 MG capsule Take 1 capsule (100 mg total) by mouth 3 (three) times daily as needed.   EPIPEN 2-PAK 0.3 MG/0.3ML SOAJ injection Use in the event of a bee sting allergy.   FLUoxetine (PROZAC) 40 MG capsule Take 1 capsule (40 mg total) by mouth daily.   fluticasone (FLONASE) 50 MCG/ACT nasal spray Place 2 sprays into both nostrils daily.   fluticasone-salmeterol (ADVAIR) 100-50 MCG/ACT AEPB Inhale 1 puff into the lungs 2 (two) times daily.  promethazine-dextromethorphan (PROMETHAZINE-DM) 6.25-15 MG/5ML syrup Take 5 mLs by mouth 4 (four) times daily as needed.   valACYclovir (VALTREX) 1000 MG tablet Take 1 tablet (1,000 mg total) by mouth daily.    Allergies: Patient is allergic to bee venom, sulfa antibiotics, and sulfonamide derivatives. Family History: Patient family history includes Cancer in her father. Social History:  Patient  reports that she has never smoked. She does not have any smokeless tobacco history on file. She reports that she does not use drugs.  Review of Systems: Constitutional: Negative for fever malaise or  anorexia Cardiovascular: negative for chest pain Respiratory: negative for SOB or persistent cough Gastrointestinal: negative for abdominal pain  Objective  Vitals: BP 102/60   Pulse (!) 58   Temp 98.2 F (36.8 C)   Ht 5\' 4"  (1.626 m)   Wt 128 lb 3.2 oz (58.2 kg)   SpO2 95%   BMI 22.01 kg/m  General: no acute distress , A&Ox3,, able to get to exam table unassisted, cough present HEENT: PEERL, conjunctiva normal, neck is supple Cardiovascular:  RRR without murmur or gallop.  Respiratory:  Good breath sounds bilaterally, CTAB with normal respiratory effort, however cough precipitated with deep breaths and occasional wheeze present.  No rales or rhonchi Skin:  Warm, no rashes Normal gait, negative straight leg raise bilaterally, +3 symmetric reflexes, normal strength.  Commons side effects, risks, benefits, and alternatives for medications and treatment plan prescribed today were discussed, and the patient expressed understanding of the given instructions. Patient is instructed to call or message via MyChart if he/she has any questions or concerns regarding our treatment plan. No barriers to understanding were identified. We discussed Red Flag symptoms and signs in detail. Patient expressed understanding regarding what to do in case of urgent or emergency type symptoms.  Medication list was reconciled, printed and provided to the patient in AVS. Patient instructions and summary information was reviewed with the patient as documented in the AVS. This note was prepared with assistance of Dragon voice recognition software. Occasional wrong-word or sound-a-like substitutions may have occurred due to the inherent limitations of voice recognition software

## 2022-08-28 NOTE — Patient Instructions (Signed)
Please follow up if symptoms do not improve or as needed.    Please go to our [facility] office to get your xrays done. You can walk in M-F between 8:30am- noon or 1pm - 5pm. Tell them you are there for xrays ordered by me. They will send me the results, then I will let you know the results with instructions.   Address: [address].  The Xray department is located in the basement.   We will call you to get you scheduled for low back MRI or referral depending upon insurance coverage.  Increase the gabapentin to twice a day.  Use the new inhaler twice a day and start oral zyrtec OTC nightly.

## 2022-08-29 ENCOUNTER — Ambulatory Visit (INDEPENDENT_AMBULATORY_CARE_PROVIDER_SITE_OTHER)
Admission: RE | Admit: 2022-08-29 | Discharge: 2022-08-29 | Disposition: A | Discharge status: Home / Self Care | Payer: 59 | Source: Ambulatory Visit | Attending: Family Medicine | Admitting: Family Medicine

## 2022-08-29 ENCOUNTER — Encounter: Payer: Self-pay | Admitting: Radiology

## 2022-08-29 DIAGNOSIS — M79604 Pain in right leg: Secondary | ICD-10-CM | POA: Diagnosis not present

## 2022-08-29 DIAGNOSIS — M545 Low back pain, unspecified: Secondary | ICD-10-CM | POA: Diagnosis not present

## 2022-08-29 DIAGNOSIS — M5431 Sciatica, right side: Secondary | ICD-10-CM

## 2022-09-02 ENCOUNTER — Other Ambulatory Visit: Payer: Self-pay | Admitting: Family Medicine

## 2022-09-20 ENCOUNTER — Encounter: Payer: Self-pay | Admitting: Family Medicine

## 2022-09-24 ENCOUNTER — Encounter: Payer: Self-pay | Admitting: Family Medicine

## 2022-09-24 ENCOUNTER — Telehealth: Payer: Self-pay | Admitting: Family Medicine

## 2022-09-24 NOTE — Telephone Encounter (Signed)
[facility] called and states if you can call them back to do a peer to peer because of the Radiology denial. Please advise.  [phone] Nichole

## 2022-09-26 ENCOUNTER — Other Ambulatory Visit: Payer: 59

## 2022-10-10 ENCOUNTER — Other Ambulatory Visit: Payer: Self-pay | Admitting: Family Medicine

## 2022-10-10 MED ORDER — GABAPENTIN 300 MG PO CAPS: 300.0000 mg | ORAL_CAPSULE | Freq: Two times a day (BID) | ORAL | 1 refills | Status: DC

## 2022-10-24 ENCOUNTER — Other Ambulatory Visit (HOSPITAL_COMMUNITY): Payer: Self-pay

## 2022-10-25 ENCOUNTER — Ambulatory Visit
Admission: RE | Admit: 2022-10-25 | Discharge: 2022-10-25 | Disposition: A | Discharge status: Home / Self Care | Payer: 59 | Source: Ambulatory Visit | Attending: Family Medicine | Admitting: Family Medicine

## 2022-10-25 DIAGNOSIS — M5431 Sciatica, right side: Secondary | ICD-10-CM

## 2022-10-25 DIAGNOSIS — M545 Low back pain, unspecified: Secondary | ICD-10-CM | POA: Diagnosis not present

## 2022-10-25 DIAGNOSIS — M48061 Spinal stenosis, lumbar region without neurogenic claudication: Secondary | ICD-10-CM | POA: Diagnosis not present

## 2022-10-31 NOTE — Progress Notes (Signed)
Please call patient: her MRI does show some arthritic changes and a disc bulge in the lower spine that could be causing her pain.  If she is still in pain (I haven't seen her since January), then I recommend a referral to [facility] for lumbar radiculopathy. We could also start physical therapy. If she has questions, then f/u in office with me.

## 2022-11-01 ENCOUNTER — Other Ambulatory Visit: Payer: Self-pay

## 2022-11-01 DIAGNOSIS — M5410 Radiculopathy, site unspecified: Secondary | ICD-10-CM

## 2022-11-13 NOTE — Therapy (Unsigned)
OUTPATIENT PHYSICAL THERAPY THORACOLUMBAR EVALUATION   Patient Name: Isabella Barrett MRN: [id number] DOB:[birth year], 57 y.o., female Today's Date: 11/14/2022  END OF SESSION:  PT End of Session - 11/14/22 1100     Visit Number 1    Number of Visits 12    Date for PT Re-Evaluation 01/09/23    Authorization Type aetna vL med [place]    PT Start Time 1105    PT Stop Time 1140    PT Time Calculation (min) 35 min    Activity Tolerance Patient tolerated treatment well    Behavior During Therapy [facility] 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   Quit 07/2022   Past Surgical History:  Procedure Laterality Date   APPENDECTOMY     Patient Active Problem List   Diagnosis Date Noted   Anaphylactic reaction to bee sting 12/12/2021   GAD (generalized anxiety disorder) 05/01/2007   Perennial allergic rhinitis 05/01/2007   Genital herpes 05/01/2007    PCP: Willow Ora, MD  REFERRING PROVIDER: Willow Ora, MD  REFERRING DIAG: M54.10 (ICD-10-CM) - Radiculopathy, unspecified spinal region  Rationale for Evaluation and Treatment: Rehabilitation  THERAPY DIAG:  Other low back pain - Plan: PT plan of care cert/re-cert  Muscle weakness (generalized) - Plan: PT plan of care cert/re-cert  ONSET DATE: Since Christmas 2023  SUBJECTIVE:                                                                                                                                                                                           SUBJECTIVE STATEMENT: States that if she stands or walks for a period of time she gets a pain in her low back and into her right leg. States that her feet stay asleep/numb states this is not new but increased numbness on the right. States if she sits down her symptoms go away. Sometimes she has pain with sleeping if she moves wrong.  States she hasn't tried riding a bike yet. States she was doing a lot of sitting in the fall as she was doing a lot of online stuff and is the only thing she can think of that causes this.  Reports she feels like she can walk about 5-10 minutes before symptoms come on.   Symptoms are not as bad in the morning.   PERTINENT HISTORY:  NA  PAIN:  Are you having pain? Yes: NPRS  scale: 6/10 Pain location: right low back and into right leg Pain description: shooting down entire leg  Aggravating factors: walking  Relieving factors: sitting   PRECAUTIONS: None  WEIGHT BEARING RESTRICTIONS: No  FALLS:  Has patient fallen in last 6 months? No   OCCUPATION: online retail business, likes to walk, swim, stretch  PLOF: Independent  PATIENT GOALS: too have less pain   OBJECTIVE:   DIAGNOSTIC FINDINGS:  MRI 10/25/22 IMPRESSION: 1. Mild spinal canal stenosis at L4-L5 due to combination of disc bulge and facet arthrosis. 2. L5-S1 right lateral recess stenosis which may be a source of right S1 radiculopathy. 3. Small medially projecting synovial cyst from the right L5-S1 facet joint measuring 5 x 2 mm.    SCREENING FOR RED FLAGS: Bowel or bladder incontinence: No Spinal tumors: No Cauda equina syndrome: No Compression fracture: No Abdominal aneurysm: No  COGNITION: Overall cognitive status: Within functional limits for tasks assessed     SENSATION: Not tested   POSTURE: rounded shoulders, forward head, and swayback  PALPATION: Increased resting tone in the right glutes and piriformis, tenderness along right lumbar paraspinals     LE Measurements Lower Extremity Right 11/14/2022 Left 11/14/2022   A/PROM MMT A/PROM MMT  Hip Flexion [facility]  WFL   Hip Extension 3+  Hip Abduction      Hip Adduction      Hip Internal rotation 35  60   Hip External rotation 40  60   Knee Flexion      Knee Extension      Ankle Dorsiflexion      Ankle Plantarflexion      Ankle  Inversion      Ankle Eversion       (Blank rows = not tested) * pain   LUMBAR SPECIAL TESTS:   + femoral neural tension R  Neg SLR B   TODAY'S TREATMENT:                                                                                                                              DATE:   11/14/2022  Therapeutic Exercise:  Aerobic: Supine: piriformis stretch  x5 10" holds B  Prone: femoral nerve stretch right x20 5" holds, prone lying on 2 pillows 5 minutes  Seated:  Standing: Neuromuscular Re-education: Manual Therapy: Therapeutic Activity: Self Care: Trigger Point Dry Needling:  Modalities:     PATIENT EDUCATION:  Education details: on current presentation, on HEP, on clinical outcomes score and POC, on anatomy, and importance of moving during the day, on negative impacts of prolonged sitting and difference between active and passive postures Person educated: Patient Education method: Explanation, Demonstration, and Handouts Education comprehension: verbalized understanding   HOME EXERCISE PROGRAM: [id number]  ASSESSMENT:  CLINICAL IMPRESSION: Patient presents to physical therapy with right low back and lower extremity pain that is worse with walking around for more than 10 minutes at a time.  Patient presents with  range of motion, strength, and postural limitations that are likely contributing to current presentation.  Patient spends most of the time sitting in a chair at work, educated patient of importance of movement throughout the day to reduce muscular neural tightness.  Patient would greatly benefit from skilled physical therapy to reduce overall physical limitations and improve overall mobility and quality of life.  OBJECTIVE IMPAIRMENTS: decreased activity tolerance, decreased mobility, difficulty walking, decreased ROM, decreased strength, postural dysfunction, and pain.   ACTIVITY LIMITATIONS: sleeping, stairs, and locomotion level  PARTICIPATION LIMITATIONS:  cleaning and community activity  PERSONAL FACTORS: Fitness and Profession are also affecting patient's functional outcome.   REHAB POTENTIAL: Good  CLINICAL DECISION MAKING: Stable/uncomplicated  EVALUATION COMPLEXITY: Low   GOALS: Goals reviewed with patient? yes  SHORT TERM GOALS: Target date: 12/12/2022  Patient will be independent in self management strategies to improve quality of life and functional outcomes. Baseline: New Program Goal status: INITIAL  2.  Patient will report at least 50% improvement in overall symptoms and/or function to demonstrate improved functional mobility Baseline: 0% better Goal status: INITIAL  3.  Patient will be able to ambulate for at least 30 minutes comfortably without left hip/lower extremity pain Baseline: 5 to 10 minutes Goal status: INITIAL      LONG TERM GOALS: Target date: 01/09/2023   Patient will report at least 75% improvement in overall symptoms and/or function to demonstrate improved functional mobility Baseline: 0% better Goal status: INITIAL  2.  Patient will be able to ambulate/stand for at least 60 minutes comfortably without left hip/lower extremity pain Baseline: 5 to 10 minutes Goal status: INITIAL  3.  Patient will report getting up every hour while working to reduce prolonged periods of time sitting down Baseline: Not currently Goal status: INITIAL    PLAN:  PT FREQUENCY: 1-2x/week 12 visits over 8 weeks certification period  PT DURATION: 8 weeks  PLANNED INTERVENTIONS: Therapeutic exercises, Therapeutic activity, Neuromuscular re-education, Balance training, Gait training, Patient/Family education, Self Care, Joint mobilization, Joint manipulation, Stair training, Orthotic/Fit training, Aquatic Therapy, Dry Needling, Electrical stimulation, Spinal manipulation, Spinal mobilization, Cryotherapy, Moist heat, Ionotophoresis /ml Dexamethasone, Manual therapy, and Re-evaluation.  PLAN FOR NEXT SESSION: hip  ROM - prone, STM, neural stretches, breathwork/core for swayback posture  12:42 PM, 11/14/22 [name], DPT Physical Therapy with [facility]

## 2022-11-14 ENCOUNTER — Encounter: Payer: Self-pay | Admitting: Physical Therapy

## 2022-11-14 ENCOUNTER — Ambulatory Visit: Payer: 59 | Admitting: Physical Therapy

## 2022-11-14 DIAGNOSIS — M5459 Other low back pain: Secondary | ICD-10-CM | POA: Diagnosis not present

## 2022-11-14 DIAGNOSIS — M6281 Muscle weakness (generalized): Secondary | ICD-10-CM | POA: Diagnosis not present

## 2022-11-18 ENCOUNTER — Other Ambulatory Visit (HOSPITAL_COMMUNITY): Payer: Self-pay

## 2022-11-19 ENCOUNTER — Encounter: Payer: 59 | Admitting: Physical Therapy

## 2022-11-22 ENCOUNTER — Other Ambulatory Visit (HOSPITAL_COMMUNITY): Payer: Self-pay

## 2022-11-26 ENCOUNTER — Encounter: Payer: Self-pay | Admitting: Physical Therapy

## 2022-11-26 ENCOUNTER — Ambulatory Visit: Payer: 59 | Admitting: Physical Therapy

## 2022-11-26 DIAGNOSIS — M6281 Muscle weakness (generalized): Secondary | ICD-10-CM

## 2022-11-26 DIAGNOSIS — M5459 Other low back pain: Secondary | ICD-10-CM | POA: Diagnosis not present

## 2022-11-26 NOTE — Therapy (Signed)
OUTPATIENT PHYSICAL THERAPY TREATMENT NOTE   Patient Name: Isabella Barrett MRN: [id number] DOB:11-09-1965, 57 y.o., female Today's Date: 11/26/2022  PCP: Willow Ora, MD   REFERRING PROVIDER: Willow Ora, MD  END OF SESSION:   PT End of Session - 11/26/22 1304     Visit Number 2    Number of Visits 12    Date for PT Re-Evaluation 01/09/23    Authorization Type aetna vL med [place]    PT Start Time 1305    PT Stop Time 1343    PT Time Calculation (min) 38 min    Activity Tolerance Patient tolerated treatment well    Behavior During Therapy [facility] for tasks assessed/performed             Past Medical History:  Diagnosis Date   Allergy    Anxiety    Depression    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   Quit 07/2022   Past Surgical History:  Procedure Laterality Date   APPENDECTOMY     Patient Active Problem List   Diagnosis Date Noted   Anaphylactic reaction to bee sting 12/12/2021   GAD (generalized anxiety disorder) 05/01/2007   Perennial allergic rhinitis 05/01/2007   Genital herpes 05/01/2007      THERAPY DIAG:  Other low back pain  Muscle weakness (generalized)    REFERRING DIAG: M54.10 (ICD-10-CM) - Radiculopathy, unspecified spinal region   Rationale for Evaluation and Treatment: Rehabilitation   ONSET DATE: Since Christmas 2023   SUBJECTIVE:                                                                                                                                                                                            SUBJECTIVE STATEMENT: 11/26/2022 States that she has been doing her exercises and feels about the same.  No current pain. Last bout of pain was this morning when she was standing doing the dishes.   Eval: States that if she stands or walks for a period of time she gets a pain in her low back and into her right leg. States that her feet stay asleep/numb states this is not new but increased numbness on  the right. States if she sits down her symptoms go away. Sometimes she has pain with sleeping if she moves wrong. States she hasn't tried riding a bike yet. States she was doing a lot of sitting in the fall as she was doing a lot of online stuff and is the only thing she can think of that causes this.  Reports she feels like she can walk about  5-10 minutes before symptoms come on.    Symptoms are not as bad in the morning.    PERTINENT HISTORY:  NA   PAIN:  Are you having pain? Yes: NPRS scale: 0/10 Pain location: right low back and into right leg Pain description: shooting down entire leg  Aggravating factors: walking  Relieving factors: sitting    PRECAUTIONS: None   WEIGHT BEARING RESTRICTIONS: No   FALLS:  Has patient fallen in last 6 months? No     OCCUPATION: online retail business, likes to walk, swim, stretch   PLOF: Independent   PATIENT GOALS: too have less pain     OBJECTIVE:    DIAGNOSTIC FINDINGS:  MRI 10/25/22 IMPRESSION: 1. Mild spinal canal stenosis at L4-L5 due to combination of disc bulge and facet arthrosis. 2. L5-S1 right lateral recess stenosis which may be a source of right S1 radiculopathy. 3. Small medially projecting synovial cyst from the right L5-S1 facet joint measuring 5 x 2 mm.       SCREENING FOR RED FLAGS: Bowel or bladder incontinence: No Spinal tumors: No Cauda equina syndrome: No Compression fracture: No Abdominal aneurysm: No   COGNITION: Overall cognitive status: Within functional limits for tasks assessed                          SENSATION: Not tested     POSTURE: rounded shoulders, forward head, and swayback   PALPATION: Increased resting tone in the right glutes and piriformis, tenderness along right lumbar paraspinals         LE Measurements       Lower Extremity Right 11/14/2022 Left 11/14/2022    A/PROM MMT A/PROM MMT  Hip Flexion [facility]   WFL    Hip Extension 15 3 15  3+  Hip Abduction          Hip  Adduction          Hip Internal rotation 35   60    Hip External rotation 40   60    Knee Flexion          Knee Extension          Ankle Dorsiflexion          Ankle Plantarflexion          Ankle Inversion          Ankle Eversion           (Blank rows = not tested) * pain     LUMBAR SPECIAL TESTS:    + femoral neural tension R  Neg SLR B     TODAY'S TREATMENT:                                                                                                                              DATE:   11/26/2022 Therapeutic Exercise:    Aerobic: Seated: piriformis stretch x3 30" holds B, sciatic nerve stretch 1.5 minutes  each leg , goddess pose 3 minutes total tactile cues, hip abd RTB 5" holds x3 bouts of 1 minute, hip IR 5x5 B RTB  Supine: piriformis stretch  reviewed Prone: femoral nerve stretch right reviewed    Seated:    Standing: Neuromuscular Re-education: Manual Therapy: Therapeutic Activity: Self Care: Trigger Point Dry Needling:  Modalities:        PATIENT EDUCATION:  Education details: on HEP and rationale for interventions, on returning to gym/swimming  Person educated: Patient Education method: Explanation, Demonstration, and Handouts Education comprehension: verbalized understanding     HOME EXERCISE PROGRAM: [id number]   ASSESSMENT:   CLINICAL IMPRESSION: 11/26/2022 Reviewed and progressed exercises as tolerated. Focused on seated exercises as these are easier for patient to perform throughout the day. Answered all questions about HEP and return to gym/swimming. Overall patient is doing well, provided patient with band and print off of new exercises for HEP adherence.   Eval: Patient presents to physical therapy with right low back and lower extremity pain that is worse with walking around for more than 10 minutes at a time.  Patient presents with range of motion, strength, and postural limitations that are likely contributing to current presentation.  Patient  spends most of the time sitting in a chair at work, educated patient of importance of movement throughout the day to reduce muscular neural tightness.  Patient would greatly benefit from skilled physical therapy to reduce overall physical limitations and improve overall mobility and quality of life.   OBJECTIVE IMPAIRMENTS: decreased activity tolerance, decreased mobility, difficulty walking, decreased ROM, decreased strength, postural dysfunction, and pain.    ACTIVITY LIMITATIONS: sleeping, stairs, and locomotion level   PARTICIPATION LIMITATIONS: cleaning and community activity   PERSONAL FACTORS: Fitness and Profession are also affecting patient's functional outcome.    REHAB POTENTIAL: Good   CLINICAL DECISION MAKING: Stable/uncomplicated   EVALUATION COMPLEXITY: Low     GOALS: Goals reviewed with patient? yes   SHORT TERM GOALS: Target date: 12/12/2022  Patient will be independent in self management strategies to improve quality of life and functional outcomes. Baseline: New Program Goal status: INITIAL   2.  Patient will report at least 50% improvement in overall symptoms and/or function to demonstrate improved functional mobility Baseline: 0% better Goal status: INITIAL   3.  Patient will be able to ambulate for at least 30 minutes comfortably without left hip/lower extremity pain Baseline: 5 to 10 minutes Goal status: INITIAL           LONG TERM GOALS: Target date: 01/09/2023    Patient will report at least 75% improvement in overall symptoms and/or function to demonstrate improved functional mobility Baseline: 0% better Goal status: INITIAL   2.  Patient will be able to ambulate/stand for at least 60 minutes comfortably without left hip/lower extremity pain Baseline: 5 to 10 minutes Goal status: INITIAL   3.  Patient will report getting up every hour while working to reduce prolonged periods of time sitting down Baseline: Not currently Goal status: INITIAL        PLAN:   PT FREQUENCY: 1-2x/week 12 visits over 8 weeks certification period   PT DURATION: 8 weeks   PLANNED INTERVENTIONS: Therapeutic exercises, Therapeutic activity, Neuromuscular re-education, Balance training, Gait training, Patient/Family education, Self Care, Joint mobilization, Joint manipulation, Stair training, Orthotic/Fit training, Aquatic Therapy, Dry Needling, Electrical stimulation, Spinal manipulation, Spinal mobilization, Cryotherapy, Moist heat, Ionotophoresis 4mg /ml Dexamethasone, Manual therapy, and Re-evaluation.   PLAN FOR NEXT SESSION: review DL leg  lower (core),  hip ROM - prone, STM, neural stretches, breathwork/core for swayback posture  1:45 PM, 11/26/22 [name], DPT Physical Therapy with [facility]

## 2022-12-02 DIAGNOSIS — F324 Major depressive disorder, single episode, in partial remission: Secondary | ICD-10-CM | POA: Diagnosis not present

## 2022-12-02 DIAGNOSIS — Z803 Family history of malignant neoplasm of breast: Secondary | ICD-10-CM | POA: Diagnosis not present

## 2022-12-02 DIAGNOSIS — F419 Anxiety disorder, unspecified: Secondary | ICD-10-CM | POA: Diagnosis not present

## 2022-12-02 DIAGNOSIS — M4800 Spinal stenosis, site unspecified: Secondary | ICD-10-CM | POA: Diagnosis not present

## 2022-12-02 DIAGNOSIS — M5430 Sciatica, unspecified side: Secondary | ICD-10-CM | POA: Diagnosis not present

## 2022-12-02 DIAGNOSIS — Z882 Allergy status to sulfonamides status: Secondary | ICD-10-CM | POA: Diagnosis not present

## 2022-12-02 DIAGNOSIS — B009 Herpesviral infection, unspecified: Secondary | ICD-10-CM | POA: Diagnosis not present

## 2022-12-04 ENCOUNTER — Encounter: Payer: Self-pay | Admitting: Physical Therapy

## 2022-12-04 ENCOUNTER — Ambulatory Visit: Payer: 59 | Admitting: Physical Therapy

## 2022-12-04 DIAGNOSIS — M6281 Muscle weakness (generalized): Secondary | ICD-10-CM

## 2022-12-04 DIAGNOSIS — M5459 Other low back pain: Secondary | ICD-10-CM | POA: Diagnosis not present

## 2022-12-04 NOTE — Therapy (Signed)
OUTPATIENT PHYSICAL THERAPY TREATMENT NOTE   Patient Name: Isabella Barrett MRN: [id number] DOB:[birth year], 57 y.o., female Today's Date: 12/04/2022  PCP: Willow Ora, MD   REFERRING PROVIDER: Willow Ora, MD  END OF SESSION:   PT End of Session - 12/04/22 1032     Visit Number 3    Number of Visits 12    Date for PT Re-Evaluation 01/09/23    Authorization Type aetna vL med [place]    PT Start Time 1024    PT Stop Time 1102    PT Time Calculation (min) 38 min    Activity Tolerance Patient tolerated treatment well    Behavior During Therapy [facility] for tasks assessed/performed              Past Medical History:  Diagnosis Date   Allergy    Anxiety    Depression    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   Quit 07/2022   Past Surgical History:  Procedure Laterality Date   APPENDECTOMY     Patient Active Problem List   Diagnosis Date Noted   Anaphylactic reaction to bee sting 12/12/2021   GAD (generalized anxiety disorder) 05/01/2007   Perennial allergic rhinitis 05/01/2007   Genital herpes 05/01/2007      THERAPY DIAG:  Other low back pain  Muscle weakness (generalized)    REFERRING DIAG: M54.10 (ICD-10-CM) - Radiculopathy, unspecified spinal region   Rationale for Evaluation and Treatment: Rehabilitation   ONSET DATE: Since Christmas 2023   SUBJECTIVE:                                                                                                                                                                                            SUBJECTIVE STATEMENT: 12/04/2022 States that she has been doing her exercises and feels about the same. States she has some numbness on her right toes and top of foot other wise no pain.   Eval: States that if she stands or walks for a period of time she gets a pain in her low back and into her right leg. States that her feet stay asleep/numb states this is not new but increased numbness on the right.  States if she sits down her symptoms go away. Sometimes she has pain with sleeping if she moves wrong. States she hasn't tried riding a bike yet. States she was doing a lot of sitting in the fall as she was doing a lot of online stuff and is the only thing she can think of that causes this.  Reports she feels like she can walk about  5-10 minutes before symptoms come on.    Symptoms are not as bad in the morning.    PERTINENT HISTORY:  NA   PAIN:  Are you having pain? Yes: NPRS scale: 0/10 Pain location: right low back and into right leg Pain description: shooting down entire leg  Aggravating factors: walking  Relieving factors: sitting    PRECAUTIONS: None   WEIGHT BEARING RESTRICTIONS: No   FALLS:  Has patient fallen in last 6 months? No     OCCUPATION: online retail business, likes to walk, swim, stretch   PLOF: Independent   PATIENT GOALS: too have less pain     OBJECTIVE:    DIAGNOSTIC FINDINGS:  MRI 10/25/22 IMPRESSION: 1. Mild spinal canal stenosis at L4-L5 due to combination of disc bulge and facet arthrosis. 2. L5-S1 right lateral recess stenosis which may be a source of right S1 radiculopathy. 3. Small medially projecting synovial cyst from the right L5-S1 facet joint measuring 5 x 2 mm.       SCREENING FOR RED FLAGS: Bowel or bladder incontinence: No Spinal tumors: No Cauda equina syndrome: No Compression fracture: No Abdominal aneurysm: No   COGNITION: Overall cognitive status: Within functional limits for tasks assessed                          SENSATION: Not tested     POSTURE: rounded shoulders, forward head, and swayback   PALPATION: Increased resting tone in the right glutes and piriformis, tenderness along right lumbar paraspinals         LE Measurements       Lower Extremity Right 11/14/2022 Left 11/14/2022    A/PROM MMT A/PROM MMT  Hip Flexion [facility]   WFL    Hip Extension 15 3 15  3+  Hip Abduction          Hip Adduction           Hip Internal rotation 35   60    Hip External rotation 40   60    Knee Flexion          Knee Extension          Ankle Dorsiflexion          Ankle Plantarflexion          Ankle Inversion          Ankle Eversion           (Blank rows = not tested) * pain     LUMBAR SPECIAL TESTS:    + femoral neural tension R  Neg SLR B     TODAY'S TREATMENT:                                                                                                                              DATE:   12/04/2022 Therapeutic Exercise:    Aerobic: Seated: piriformis stretch x3 30" holds B and both IR and ER,  hamstring stretch x3 30" holds B       s/l: clamshells 2x15 B with holds at top , reverse clamshells x15 B    Standing: piriformis stretch x5 15" holds B counter support, self mobilization to piriformis muscle with tennis ball at wall 4 minutes, butt squeeze with focus on relaxing muscle afterwards x10 5" holds Neuromuscular Re-education: Manual Therapy: Therapeutic Activity: Self Care: Trigger Point Dry Needling:  Modalities:        PATIENT EDUCATION:  Education details: on HEP  Person educated: Patient Education method: Explanation, Facilities manager, and Handouts Education comprehension: verbalized understanding     HOME EXERCISE PROGRAM: [id number]   ASSESSMENT:   CLINICAL IMPRESSION: 12/04/2022 Started with seated stretches to help with foot numbness/tingling. After stretching tingling almost completely abolished. Discussed performing multiple reps of an exercises that helps alleviate symptoms but to not hang out in this stretch/position all day. Reduced symptoms end of session added new exercises to HEP.   Eval: Patient presents to physical therapy with right low back and lower extremity pain that is worse with walking around for more than 10 minutes at a time.  Patient presents with range of motion, strength, and postural limitations that are likely contributing to current presentation.   Patient spends most of the time sitting in a chair at work, educated patient of importance of movement throughout the day to reduce muscular neural tightness.  Patient would greatly benefit from skilled physical therapy to reduce overall physical limitations and improve overall mobility and quality of life.   OBJECTIVE IMPAIRMENTS: decreased activity tolerance, decreased mobility, difficulty walking, decreased ROM, decreased strength, postural dysfunction, and pain.    ACTIVITY LIMITATIONS: sleeping, stairs, and locomotion level   PARTICIPATION LIMITATIONS: cleaning and community activity   PERSONAL FACTORS: Fitness and Profession are also affecting patient's functional outcome.    REHAB POTENTIAL: Good   CLINICAL DECISION MAKING: Stable/uncomplicated   EVALUATION COMPLEXITY: Low     GOALS: Goals reviewed with patient? yes   SHORT TERM GOALS: Target date: 12/12/2022  Patient will be independent in self management strategies to improve quality of life and functional outcomes. Baseline: New Program Goal status: INITIAL   2.  Patient will report at least 50% improvement in overall symptoms and/or function to demonstrate improved functional mobility Baseline: 0% better Goal status: INITIAL   3.  Patient will be able to ambulate for at least 30 minutes comfortably without left hip/lower extremity pain Baseline: 5 to 10 minutes Goal status: INITIAL           LONG TERM GOALS: Target date: 01/09/2023    Patient will report at least 75% improvement in overall symptoms and/or function to demonstrate improved functional mobility Baseline: 0% better Goal status: INITIAL   2.  Patient will be able to ambulate/stand for at least 60 minutes comfortably without left hip/lower extremity pain Baseline: 5 to 10 minutes Goal status: INITIAL   3.  Patient will report getting up every hour while working to reduce prolonged periods of time sitting down Baseline: Not currently Goal status:  INITIAL       PLAN:   PT FREQUENCY: 1-2x/week 12 visits over 8 weeks certification period   PT DURATION: 8 weeks   PLANNED INTERVENTIONS: Therapeutic exercises, Therapeutic activity, Neuromuscular re-education, Balance training, Gait training, Patient/Family education, Self Care, Joint mobilization, Joint manipulation, Stair training, Orthotic/Fit training, Aquatic Therapy, Dry Needling, Electrical stimulation, Spinal manipulation, Spinal mobilization, Cryotherapy, Moist heat, Ionotophoresis 4mg /ml Dexamethasone, Manual therapy, and Re-evaluation.   PLAN FOR  NEXT SESSION: review DL leg lower (core),  hip ROM - prone, STM, neural stretches, breathwork/core for swayback posture  11:04 AM, 12/04/22 [name], DPT Physical Therapy with [facility]

## 2022-12-16 ENCOUNTER — Ambulatory Visit (INDEPENDENT_AMBULATORY_CARE_PROVIDER_SITE_OTHER): Payer: 59 | Admitting: Family Medicine

## 2022-12-16 ENCOUNTER — Other Ambulatory Visit (HOSPITAL_COMMUNITY): Payer: Self-pay

## 2022-12-16 ENCOUNTER — Encounter: Payer: Self-pay | Admitting: Family Medicine

## 2022-12-16 VITALS — BP 96/40 | HR 63 | Temp 98.6°F | Ht 64.0 in | Wt 128.6 lb

## 2022-12-16 DIAGNOSIS — Z1159 Encounter for screening for other viral diseases: Secondary | ICD-10-CM | POA: Diagnosis not present

## 2022-12-16 DIAGNOSIS — Z1231 Encounter for screening mammogram for malignant neoplasm of breast: Secondary | ICD-10-CM

## 2022-12-16 DIAGNOSIS — M51369 Other intervertebral disc degeneration, lumbar region without mention of lumbar back pain or lower extremity pain: Secondary | ICD-10-CM

## 2022-12-16 DIAGNOSIS — F411 Generalized anxiety disorder: Secondary | ICD-10-CM | POA: Diagnosis not present

## 2022-12-16 DIAGNOSIS — J3089 Other allergic rhinitis: Secondary | ICD-10-CM

## 2022-12-16 DIAGNOSIS — Z0000 Encounter for general adult medical examination without abnormal findings: Secondary | ICD-10-CM

## 2022-12-16 DIAGNOSIS — A6000 Herpesviral infection of urogenital system, unspecified: Secondary | ICD-10-CM

## 2022-12-16 DIAGNOSIS — M5416 Radiculopathy, lumbar region: Secondary | ICD-10-CM | POA: Insufficient documentation

## 2022-12-16 DIAGNOSIS — M5136 Other intervertebral disc degeneration, lumbar region: Secondary | ICD-10-CM | POA: Diagnosis not present

## 2022-12-16 HISTORY — DX: Other intervertebral disc degeneration, lumbar region without mention of lumbar back pain or lower extremity pain: M51.369

## 2022-12-16 LAB — CBC WITH DIFFERENTIAL/PLATELET
Basophils Absolute: 0.1 10*3/uL (ref 0.0–0.1)
Basophils Relative: 1.2 % (ref 0.0–3.0)
Eosinophils Absolute: 0 10*3/uL (ref 0.0–0.7)
Eosinophils Relative: 0.9 % (ref 0.0–5.0)
HCT: 41.5 % (ref 36.0–46.0)
Hemoglobin: 13.8 g/dL (ref 12.0–15.0)
Lymphocytes Relative: 27.4 % (ref 12.0–46.0)
Lymphs Abs: 1.1 10*3/uL (ref 0.7–4.0)
MCHC: 33.2 g/dL (ref 30.0–36.0)
MCV: 92.6 fl (ref 78.0–100.0)
Monocytes Absolute: 0.3 10*3/uL (ref 0.1–1.0)
Monocytes Relative: 6.8 % (ref 3.0–12.0)
Neutro Abs: 2.7 10*3/uL (ref 1.4–7.7)
Neutrophils Relative %: 63.7 % (ref 43.0–77.0)
Platelets: 207 10*3/uL (ref 150.0–400.0)
RBC: 4.48 Mil/uL (ref 3.87–5.11)
RDW: 13.5 % (ref 11.5–15.5)
WBC: 4.2 10*3/uL (ref 4.0–10.5)

## 2022-12-16 LAB — LIPID PANEL
Cholesterol: 174 mg/dL (ref 0–200)
HDL: 60.1 mg/dL (ref >39.00)
LDL Cholesterol: 97 mg/dL (ref 0–99)
NonHDL: 114.17
Total CHOL/HDL Ratio: 3
Triglycerides: 86 mg/dL (ref 0.0–149.0)
VLDL: 17.2 mg/dL (ref 0.0–40.0)

## 2022-12-16 LAB — COMPREHENSIVE METABOLIC PANEL
ALT: 19 U/L (ref 0–35)
AST: 18 U/L (ref 0–37)
Albumin: 4.2 g/dL (ref 3.5–5.2)
Alkaline Phosphatase: 57 U/L (ref 39–117)
BUN: 19 mg/dL (ref 6–23)
CO2: 25 mEq/L (ref 19–32)
Calcium: 9.7 mg/dL (ref 8.4–10.5)
Chloride: 105 mEq/L (ref 96–112)
Creatinine, Ser: 0.82 mg/dL (ref 0.40–1.20)
GFR: 79.02 mL/min (ref >60.00)
Glucose, Bld: 75 mg/dL (ref 70–99)
Potassium: 4.6 mEq/L (ref 3.5–5.1)
Sodium: 141 mEq/L (ref 135–145)
Total Bilirubin: 0.4 mg/dL (ref 0.2–1.2)
Total Protein: 6.8 g/dL (ref 6.0–8.3)

## 2022-12-16 MED ORDER — FAMCICLOVIR 250 MG PO TABS
250.0000 mg | ORAL_TABLET | Freq: Two times a day (BID) | ORAL | 3 refills | Status: DC
  Filled 2022-12-16: qty 60, 30d supply, fill #0
  Filled 2023-01-13: qty 60, 30d supply, fill #1
  Filled 2023-01-17 – 2023-02-12 (×2): qty 60, 30d supply, fill #2
  Filled 2023-03-14: qty 60, 30d supply, fill #3
  Filled 2023-04-20: qty 60, 30d supply, fill #4
  Filled 2023-05-18: qty 60, 30d supply, fill #5
  Filled 2023-06-17: qty 60, 30d supply, fill #6
  Filled 2023-07-16: qty 60, 30d supply, fill #7
  Filled 2023-08-15: qty 60, 30d supply, fill #8
  Filled 2023-09-14: qty 60, 30d supply, fill #9
  Filled 2023-10-14: qty 60, 30d supply, fill #10
  Filled 2023-11-12: qty 60, 30d supply, fill #11

## 2022-12-16 NOTE — Progress Notes (Signed)
Subjective  Chief Complaint  Patient presents with   Annual Exam    Pt  here for Annual xam and is     HPI: Isabella Barrett is a 57 y.o. female who presents to [facility] [place] today for a Female Wellness Visit. She also has the concerns and/or needs as listed above in the chief complaint. These will be addressed in addition to the Health Maintenance Visit.   Wellness Visit: annual visit with health maintenance review and exam without Pap  Health maintenance: Overdue for mammogram.  Negative Cologuard last year.  Pap smear current and normal.  Healthy lifestyle, regular exercise.  Feels well overall.  Immunizations current.  Eye exam current.    Chronic disease f/u and/or acute problem visit: (deemed necessary to be done in addition to the wellness visit): Low back pain, bulging disc: Somewhat improved.  No longer having pain at rest.  Will have pain with prolonged standing or with some exercise.  No radicular symptoms anymore.  Using gabapentin at night, possibly beneficial.  Too sedating to use during the day.  Started physical therapy a couple weeks ago.  No bowel or bladder incontinence.  Using Aleve at night intermittently for pain which helps. Generalized anxiety disorder remains well-controlled on Prozac 40 mg daily. Recurrent genital herpes: Taking Valtrex 1000 mg daily for prevention.  Even on this she feels that she will get an outbreak monthly.  However would like to change back to Famvir due to large pill size and Valtrex.  No current outbreaks.  No other STDs. allergies: Not needing medications at this time.  Assessment  1. Annual physical exam   2. GAD (generalized anxiety disorder)   3. Need for hepatitis C screening test   4. Bulging lumbar disc   5. Perennial allergic rhinitis   6. Recurrent genital herpes   7. Screening mammogram for breast cancer      Plan  Female Wellness Visit: Age appropriate Health Maintenance and Prevention measures were  discussed with patient. Included topics are cancer screening recommendations, ways to keep healthy (see AVS) including dietary and exercise recommendations, regular eye and dental care, use of seat belts, and avoidance of moderate alcohol use and tobacco use.  Patient to schedule mammogram BMI: discussed patient's BMI and encouraged positive lifestyle modifications to help get to or maintain a target BMI. HM needs and immunizations were addressed and ordered. See below for orders. See HM and immunization section for updates. Routine labs and screening tests ordered including cmp, cbc and lipids where appropriate. Discussed recommendations regarding Vit D and calcium supplementation (see AVS)  Chronic disease management visit and/or acute problem visit: Anxiety disorder is well-controlled on Prozac 40 mg daily. Back pain: Herniated lumbar disc.  Improving slowly.  Continue physical therapy.  Aleve as needed.  She can see if she can do without the gabapentin she is no longer having radicular symptoms.  Continue exercise.  Follow-up if not improved.  Or resolved Allergies: Uses Flonase as needed Recurrent genital herpes: Changed to Famvir 500 twice daily for suppression.  Stop Valtrex due to large pill size.  Follow up: 12 months for complete physical Orders Placed This Encounter  Procedures   MM DIGITAL SCREENING BILATERAL   CBC with Differential/Platelet   Comprehensive metabolic panel   Lipid panel   TSH   Hepatitis C antibody   Meds ordered this encounter  Medications   famciclovir (FAMVIR) 250 MG tablet    Sig: Take 1 tablet (  250 mg total) by mouth 2 (two) times daily.    Dispense:  180 tablet    Refill:  3      Body mass index is 22.07 kg/m. Wt Readings from Last 3 Encounters:  12/16/22 128 lb 9.6 oz (58.3 kg)  08/28/22 128 lb 3.2 oz (58.2 kg)  08/05/22 127 lb 12.8 oz (58 kg)     Patient Active Problem List   Diagnosis Date Noted   Bulging lumbar disc 12/16/2022     Symptomatic L5-S1 by MRI 2024; mild central canal stenosis Gabapentin and PT    Anaphylactic reaction to bee sting 12/12/2021    Epi pen    GAD (generalized anxiety disorder) 05/01/2007    prozac     Perennial allergic rhinitis 05/01/2007   Recurrent genital herpes 05/01/2007   Health Maintenance  Topic Date Due   Hepatitis C Screening  Never done   MAMMOGRAM  01/30/2022   COVID-19 Vaccine (3 - Pfizer risk series) 01/01/2023 (Originally 12/30/2019)   INFLUENZA VACCINE  02/27/2023   DTaP/Tdap/Td (2 - Td or Tdap) 01/21/2024   Fecal DNA (Cologuard)  11/30/2024   PAP SMEAR-Modifier  01/08/2026   HIV Screening  Completed   Zoster Vaccines- Shingrix  Completed   HPV VACCINES  Aged Out   Immunization History  Administered Date(s) Administered   Influenza, Seasonal, Injecte, Preservative Fre 04/22/2014   PFIZER(Purple Top)SARS-COV-2 Vaccination 11/11/2019, 12/02/2019   Tdap 01/20/2014   Zoster Recombinat (Shingrix) 12/12/2021, 02/14/2022   We updated and reviewed the patient's past history in detail and it is documented below. Allergies: Patient is allergic to bee venom, sulfa antibiotics, and sulfonamide derivatives. 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 Past Surgical History Patient  has a past surgical history that includes Appendectomy. Family History: Patient family history includes Cancer in her father. Social History:  Patient  reports that she has never smoked. She does not have any smokeless tobacco history on file. She reports that she does not use drugs.  Review of Systems: Constitutional: negative for fever or malaise Ophthalmic: negative for photophobia, double vision or loss of vision Cardiovascular: negative for chest pain, dyspnea on exertion, or new LE swelling Respiratory: negative for SOB or persistent cough Gastrointestinal: negative  for abdominal pain, change in bowel habits or melena Genitourinary: negative for dysuria or gross hematuria, no abnormal uterine bleeding or disharge Musculoskeletal: negative for new gait disturbance or muscular weakness Integumentary: negative for new or persistent rashes, no breast lumps Neurological: negative for TIA or stroke symptoms Psychiatric: negative for SI or delusions Allergic/Immunologic: negative for hives  Patient Care Team    Relationship Specialty Notifications Start End  Willow Ora, MD PCP - General Family Medicine  08/28/22     Objective  Vitals: BP (!) 96/40   Pulse 63   Temp 98.6 F (37 C)   Ht 5\' 4"  (1.626 m)   Wt 128 lb 9.6 oz (58.3 kg)   SpO2 95%   BMI 22.07 kg/m  General:  Well developed, well nourished, no acute distress  Psych:  Alert and orientedx3,normal mood and affect HEENT:  Normocephalic, atraumatic, non-icteric sclera,  supple neck without adenopathy, mass or thyromegaly Cardiovascular:  Normal S1, S2, RRR without gallop, rub or murmur Respiratory:  Good breath sounds bilaterally, CTAB with normal respiratory effort Gastrointestinal: normal bowel sounds, soft, non-tender, no noted masses. No HSM MSK: extremities without edema, joints without erythema  or swelling Neurologic:    Mental status is normal.  Gross motor and sensory exams are normal.  No tremor  Commons side effects, risks, benefits, and alternatives for medications and treatment plan prescribed today were discussed, and the patient expressed understanding of the given instructions. Patient is instructed to call or message via MyChart if he/she has any questions or concerns regarding our treatment plan. No barriers to understanding were identified. We discussed Red Flag symptoms and signs in detail. Patient expressed understanding regarding what to do in case of urgent or emergency type symptoms.  Medication list was reconciled, printed and provided to the patient in AVS. Patient  instructions and summary information was reviewed with the patient as documented in the AVS. This note was prepared with assistance of Dragon voice recognition software. Occasional wrong-word or sound-a-like substitutions may have occurred due to the inherent limitations of voice recognition software

## 2022-12-16 NOTE — Patient Instructions (Signed)
Please return in 12 months for your annual complete physical; please come fasting.   I will release your lab results to you on your MyChart account with further instructions. You may see the results before I do, but when I review them I will send you a message with my report or have my assistant call you if things need to be discussed. Please reply to my message with any questions. Thank you!   If you have any questions or concerns, please don't hesitate to send me a message via MyChart or call the office at [phone]. Thank you for visiting with [place] today! It's our pleasure caring for you.   Please call the office checked below to schedule your appointment for your mammogram and/or bone density screen (the checked studies were ordered): [x]   Mammogram  []   Bone Density  [x]   [facility]     [address] [place], [place]        [phone]         []   [facility]  [address] [place], [place]  [phone]  Please do these things to maintain good health!  Exercise at least 30-45 minutes a day,  4-5 days a week.  Eat a low-fat diet with lots of fruits and vegetables, up to 7-9 servings per day. Drink plenty of water daily. Try to drink 8 8oz glasses per day. Seatbelts can save your life. Always wear your seatbelt. Place Smoke Detectors on every level of your home and check batteries every year. Schedule an appointment with an eye doctor for an eye exam every 1-2 years Safe sex - use condoms to protect yourself from STDs if you could be exposed to these types of infections. Use birth control if you do not want to become pregnant and are sexually active. Avoid heavy alcohol use. If you drink, keep it to less than 2 drinks/day and not every day. Health Care Power of Attorney.  Choose someone you trust that could speak for you if you became unable to speak for yourself. Depression is common in our stressful world.If you're feeling down or  losing interest in things you normally enjoy, please come in for a visit. If anyone is threatening or hurting you, please get help. Physical or Emotional Violence is never OK.

## 2022-12-17 LAB — TSH: TSH: 1.56 u[IU]/mL (ref 0.35–5.50)

## 2022-12-17 LAB — HEPATITIS C ANTIBODY: Hepatitis C Ab: NONREACTIVE

## 2022-12-18 ENCOUNTER — Ambulatory Visit: Payer: 59 | Admitting: Physical Therapy

## 2022-12-18 ENCOUNTER — Encounter: Payer: Self-pay | Admitting: Physical Therapy

## 2022-12-18 DIAGNOSIS — M5459 Other low back pain: Secondary | ICD-10-CM

## 2022-12-18 DIAGNOSIS — M6281 Muscle weakness (generalized): Secondary | ICD-10-CM

## 2022-12-18 NOTE — Progress Notes (Signed)
Labs reviewed.  The 10-year ASCVD risk score (Arnett DK, et al., 2019) is: 1.3%   Values used to calculate the score:     Age: 57 years     Sex: Female     Is Non-Hispanic African American: No     Diabetic: No     Tobacco smoker: No     Systolic Blood Pressure: 96 mmHg     Is BP treated: No     HDL Cholesterol: 60.1 mg/dL     Total Cholesterol: 174 mg/dL

## 2022-12-18 NOTE — Therapy (Signed)
OUTPATIENT PHYSICAL THERAPY TREATMENT NOTE   Patient Name: Isabella Barrett MRN: [id number] DOB:August 06, 1965, 57 y.o., female Today's Date: 12/18/2022  PCP: Willow Ora, MD   REFERRING PROVIDER: Willow Ora, MD  END OF SESSION:   PT End of Session - 12/18/22 1005     Visit Number 4    Number of Visits 12    Date for PT Re-Evaluation 01/09/23    Authorization Type aetna vL med [place]    PT Start Time 1015    PT Stop Time 1055    PT Time Calculation (min) 40 min    Activity Tolerance Patient tolerated treatment well    Behavior During Therapy [facility] for tasks assessed/performed              Past Medical History:  Diagnosis Date   Allergy    Anxiety    Bulging lumbar disc 12/16/2022   Symptomatic L5-S1 by MRI 2024; mild central canal stenosis Gabapentin and PT   Depression    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   Quit 07/2022   Past Surgical History:  Procedure Laterality Date   APPENDECTOMY     Patient Active Problem List   Diagnosis Date Noted   Bulging lumbar disc 12/16/2022   Anaphylactic reaction to bee sting 12/12/2021   GAD (generalized anxiety disorder) 05/01/2007   Perennial allergic rhinitis 05/01/2007   Recurrent genital herpes 05/01/2007      THERAPY DIAG:  Other low back pain  Muscle weakness (generalized)    REFERRING DIAG: M54.10 (ICD-10-CM) - Radiculopathy, unspecified spinal region   Rationale for Evaluation and Treatment: Rehabilitation   ONSET DATE: Since Christmas 2023   SUBJECTIVE:                                                                                                                                                                                            SUBJECTIVE STATEMENT: 12/18/2022 States that she didn't have a good week to focus on her stuff.States she also misplaced the exercises and she  could not remember all of them. States she feels a little bit better.   Eval: States that if she  stands or walks for a period of time she gets a pain in her low back and into her right leg. States that her feet stay asleep/numb states this is not new but increased numbness on the right. States if she sits down her symptoms go away. Sometimes she has pain with sleeping if she moves wrong. States she hasn't tried riding a bike yet. States she was doing a lot of sitting in the  fall as she was doing a lot of online stuff and is the only thing she can think of that causes this.  Reports she feels like she can walk about 5-10 minutes before symptoms come on.    Symptoms are not as bad in the morning.    PERTINENT HISTORY:  NA   PAIN:  Are you having pain? Yes: NPRS scale: 0/10 Pain location: right low back and into right leg Pain description: shooting down entire leg  Aggravating factors: walking  Relieving factors: sitting    PRECAUTIONS: None   WEIGHT BEARING RESTRICTIONS: No   FALLS:  Has patient fallen in last 6 months? No     OCCUPATION: online retail business, likes to walk, swim, stretch   PLOF: Independent   PATIENT GOALS: too have less pain     OBJECTIVE:    DIAGNOSTIC FINDINGS:  MRI 10/25/22 IMPRESSION: 1. Mild spinal canal stenosis at L4-L5 due to combination of disc bulge and facet arthrosis. 2. L5-S1 right lateral recess stenosis which may be a source of right S1 radiculopathy. 3. Small medially projecting synovial cyst from the right L5-S1 facet joint measuring 5 x 2 mm.       SCREENING FOR RED FLAGS: Bowel or bladder incontinence: No Spinal tumors: No Cauda equina syndrome: No Compression fracture: No Abdominal aneurysm: No   COGNITION: Overall cognitive status: Within functional limits for tasks assessed                          SENSATION: Not tested     POSTURE: rounded shoulders, forward head, and swayback   PALPATION: Increased resting tone in the right glutes and piriformis, tenderness along right lumbar paraspinals         LE  Measurements       Lower Extremity Right 11/14/2022 Left 11/14/2022    A/PROM MMT A/PROM MMT  Hip Flexion [facility]   WFL    Hip Extension 15 3 15  3+  Hip Abduction          Hip Adduction          Hip Internal rotation 35   60    Hip External rotation 40   60    Knee Flexion          Knee Extension          Ankle Dorsiflexion          Ankle Plantarflexion          Ankle Inversion          Ankle Eversion           (Blank rows = not tested) * pain     LUMBAR SPECIAL TESTS:    + femoral neural tension R  Neg SLR B     TODAY'S TREATMENT:  DATE:   12/18/2022 Therapeutic Exercise:    Aerobic: Seated: piriformis stretch 2x3 30" holds B and both IR and ER, hamstring stretch x3 30" holds B Supine: pelvic tilts 2 minutes   Quad: pelvic tilts 2.5 minutes, child's pose rock backs with hip IR and ER 3 minutes each     Standing:did not tolerate ball exercise  Neuromuscular Re-education: hip hinge over table to target with weight over toes 3 minutes x2, prior demo 3 minutes  Manual Therapy: Therapeutic Activity: Self Care: Trigger Point Dry Needling:  Modalities:        PATIENT EDUCATION:  Education details: on HEP, reviewed and set up medbridge for HEP adherence  Person educated: Patient Education method: Programmer, multimedia, Facilities manager, and Handouts Education comprehension: verbalized understanding     HOME EXERCISE PROGRAM: [id number]   ASSESSMENT:   CLINICAL IMPRESSION: 12/18/2022 Reviewed and set up medbridge account secondary to losing papers for HEP and needing another means to access material. Tolerated pelvic tilts well with good form. Added hip hinge which patient was able to do well but towards end of 3 minutes started getting symptoms in right leg. Able to resolve symptoms with piriformis stretch and return to hip hinge exercise. Fatigue in legs noted end  of session. No symptoms noted end of session. Will continue with current POC as tolerated.  Eval: Patient presents to physical therapy with right low back and lower extremity pain that is worse with walking around for more than 10 minutes at a time.  Patient presents with range of motion, strength, and postural limitations that are likely contributing to current presentation.  Patient spends most of the time sitting in a chair at work, educated patient of importance of movement throughout the day to reduce muscular neural tightness.  Patient would greatly benefit from skilled physical therapy to reduce overall physical limitations and improve overall mobility and quality of life.   OBJECTIVE IMPAIRMENTS: decreased activity tolerance, decreased mobility, difficulty walking, decreased ROM, decreased strength, postural dysfunction, and pain.    ACTIVITY LIMITATIONS: sleeping, stairs, and locomotion level   PARTICIPATION LIMITATIONS: cleaning and community activity   PERSONAL FACTORS: Fitness and Profession are also affecting patient's functional outcome.    REHAB POTENTIAL: Good   CLINICAL DECISION MAKING: Stable/uncomplicated   EVALUATION COMPLEXITY: Low     GOALS: Goals reviewed with patient? yes   SHORT TERM GOALS: Target date: 12/12/2022  Patient will be independent in self management strategies to improve quality of life and functional outcomes. Baseline: New Program Goal status: INITIAL   2.  Patient will report at least 50% improvement in overall symptoms and/or function to demonstrate improved functional mobility Baseline: 0% better Goal status: INITIAL   3.  Patient will be able to ambulate for at least 30 minutes comfortably without left hip/lower extremity pain Baseline: 5 to 10 minutes Goal status: INITIAL           LONG TERM GOALS: Target date: 01/09/2023    Patient will report at least 75% improvement in overall symptoms and/or function to demonstrate improved  functional mobility Baseline: 0% better Goal status: INITIAL   2.  Patient will be able to ambulate/stand for at least 60 minutes comfortably without left hip/lower extremity pain Baseline: 5 to 10 minutes Goal status: INITIAL   3.  Patient will report getting up every hour while working to reduce prolonged periods of time sitting down Baseline: Not currently Goal status: INITIAL       PLAN:   PT FREQUENCY: 1-2x/week 12  visits over 8 weeks certification period   PT DURATION: 8 weeks   PLANNED INTERVENTIONS: Therapeutic exercises, Therapeutic activity, Neuromuscular re-education, Balance training, Gait training, Patient/Family education, Self Care, Joint mobilization, Joint manipulation, Stair training, Orthotic/Fit training, Aquatic Therapy, Dry Needling, Electrical stimulation, Spinal manipulation, Spinal mobilization, Cryotherapy, Moist heat, Ionotophoresis 4mg /ml Dexamethasone, Manual therapy, and Re-evaluation.   PLAN FOR NEXT SESSION: review DL leg lower (core),  hip ROM - prone, STM, neural stretches, breathwork/core for swayback posture  10:57 AM, 12/18/22 [name], DPT Physical Therapy with [facility]

## 2022-12-21 ENCOUNTER — Other Ambulatory Visit: Payer: Self-pay | Admitting: Family Medicine

## 2022-12-25 ENCOUNTER — Encounter: Payer: Self-pay | Admitting: Physical Therapy

## 2022-12-25 ENCOUNTER — Ambulatory Visit: Payer: 59 | Admitting: Physical Therapy

## 2022-12-25 DIAGNOSIS — M5459 Other low back pain: Secondary | ICD-10-CM

## 2022-12-25 DIAGNOSIS — M6281 Muscle weakness (generalized): Secondary | ICD-10-CM | POA: Diagnosis not present

## 2022-12-25 NOTE — Therapy (Signed)
OUTPATIENT PHYSICAL THERAPY TREATMENT NOTE   Patient Name: Isabella Barrett MRN: [id number] DOB:[birth year], 57 y.o., female Today's Date: 12/25/2022  PCP: Willow Ora, MD   REFERRING PROVIDER: Willow Ora, MD  END OF SESSION:   PT End of Session - 12/25/22 1010     Visit Number 5    Number of Visits 12    Date for PT Re-Evaluation 01/09/23    Authorization Type aetna vL med [place]    PT Start Time 1015    PT Stop Time 1055    PT Time Calculation (min) 40 min    Activity Tolerance Patient tolerated treatment well    Behavior During Therapy [facility] for tasks assessed/performed              Past Medical History:  Diagnosis Date   Allergy    Anxiety    Bulging lumbar disc 12/16/2022   Symptomatic L5-S1 by MRI 2024; mild central canal stenosis Gabapentin and PT   Depression    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   Quit 07/2022   Past Surgical History:  Procedure Laterality Date   APPENDECTOMY     Patient Active Problem List   Diagnosis Date Noted   Bulging lumbar disc 12/16/2022   Anaphylactic reaction to bee sting 12/12/2021   GAD (generalized anxiety disorder) 05/01/2007   Perennial allergic rhinitis 05/01/2007   Recurrent genital herpes 05/01/2007      THERAPY DIAG:  Other low back pain  Muscle weakness (generalized)    REFERRING DIAG: M54.10 (ICD-10-CM) - Radiculopathy, unspecified spinal region   Rationale for Evaluation and Treatment: Rehabilitation   ONSET DATE: Since Christmas 2023   SUBJECTIVE:                                                                                                                                                                                            SUBJECTIVE STATEMENT: 12/25/2022 States that she is not in as much pain but it still hurts. States she hasn't done her exercises like she should. States she does the piriformis stretch a lot.  Eval: States that if she stands or walks for a  period of time she gets a pain in her low back and into her right leg. States that her feet stay asleep/numb states this is not new but increased numbness on the right. States if she sits down her symptoms go away. Sometimes she has pain with sleeping if she moves wrong. States she hasn't tried riding a bike yet. States she was doing a lot of sitting in the fall as she was doing  a lot of online stuff and is the only thing she can think of that causes this.  Reports she feels like she can walk about 5-10 minutes before symptoms come on.    Symptoms are not as bad in the morning.    PERTINENT HISTORY:  NA   PAIN:  Are you having pain? Yes: NPRS scale: 0/10 Pain location: right low back and into right leg Pain description: shooting down entire leg  Aggravating factors: walking  Relieving factors: sitting    PRECAUTIONS: None   WEIGHT BEARING RESTRICTIONS: No   FALLS:  Has patient fallen in last 6 months? No     OCCUPATION: online retail business, likes to walk, swim, stretch   PLOF: Independent   PATIENT GOALS: too have less pain     OBJECTIVE:    DIAGNOSTIC FINDINGS:  MRI 10/25/22 IMPRESSION: 1. Mild spinal canal stenosis at L4-L5 due to combination of disc bulge and facet arthrosis. 2. L5-S1 right lateral recess stenosis which may be a source of right S1 radiculopathy. 3. Small medially projecting synovial cyst from the right L5-S1 facet joint measuring 5 x 2 mm.       SCREENING FOR RED FLAGS: Bowel or bladder incontinence: No Spinal tumors: No Cauda equina syndrome: No Compression fracture: No Abdominal aneurysm: No   COGNITION: Overall cognitive status: Within functional limits for tasks assessed                          SENSATION: Not tested     POSTURE: rounded shoulders, forward head, and swayback   PALPATION: Increased resting tone in the right glutes and piriformis, tenderness along right lumbar paraspinals         LE Measurements       Lower  Extremity Right 11/14/2022 Left 11/14/2022    A/PROM MMT A/PROM MMT  Hip Flexion [facility]   WFL    Hip Extension 15 3 15  3+  Hip Abduction          Hip Adduction          Hip Internal rotation 35   60    Hip External rotation 40   60    Knee Flexion          Knee Extension          Ankle Dorsiflexion          Ankle Plantarflexion          Ankle Inversion          Ankle Eversion           (Blank rows = not tested) * pain     LUMBAR SPECIAL TESTS:    + femoral neural tension R  Neg SLR B     TODAY'S TREATMENT:  DATE:   12/25/2022 Therapeutic Exercise:    Aerobic: Seated: piriformis stretch 2x3 30" holds B and both IR and ER, reviewed entire HEP  Supine:    Standing: sumo squat x25   Neuromuscular Re-education: long exhale breathing - focus on lower rib decompression and approximation - education on rationale and importance 20 minutes.  Manual Therapy: Therapeutic Activity: Self Care: Trigger Point Dry Needling:  Modalities:        PATIENT EDUCATION:  Education details: on HEP, on breathing mechanics, anatomy, rationale behind exercises and use of core/diaphragm  Person educated: Patient Education method: Explanation, Demonstration, and Handouts Education comprehension: verbalized understanding     HOME EXERCISE PROGRAM: [id number]   ASSESSMENT:   CLINICAL IMPRESSION: 12/25/2022 Session focused on education of breathing anatomy, rationale behind exercises and link with pain, strength, central nervous system and parasympathetic nervous system. Answered all questions and tolerated new exercises well. Discussed stopping her leg lifts in the morning as this was increasing pain in pelvic region as patient has tendency to hold her breath. Overall tolerated session well and would continue to benefit fro skilled PT at this time.   Eval: Patient presents to  physical therapy with right low back and lower extremity pain that is worse with walking around for more than 10 minutes at a time.  Patient presents with range of motion, strength, and postural limitations that are likely contributing to current presentation.  Patient spends most of the time sitting in a chair at work, educated patient of importance of movement throughout the day to reduce muscular neural tightness.  Patient would greatly benefit from skilled physical therapy to reduce overall physical limitations and improve overall mobility and quality of life.   OBJECTIVE IMPAIRMENTS: decreased activity tolerance, decreased mobility, difficulty walking, decreased ROM, decreased strength, postural dysfunction, and pain.    ACTIVITY LIMITATIONS: sleeping, stairs, and locomotion level   PARTICIPATION LIMITATIONS: cleaning and community activity   PERSONAL FACTORS: Fitness and Profession are also affecting patient's functional outcome.    REHAB POTENTIAL: Good   CLINICAL DECISION MAKING: Stable/uncomplicated   EVALUATION COMPLEXITY: Low     GOALS: Goals reviewed with patient? yes   SHORT TERM GOALS: Target date: 12/12/2022  Patient will be independent in self management strategies to improve quality of life and functional outcomes. Baseline: New Program Goal status: INITIAL   2.  Patient will report at least 50% improvement in overall symptoms and/or function to demonstrate improved functional mobility Baseline: 0% better Goal status: INITIAL   3.  Patient will be able to ambulate for at least 30 minutes comfortably without left hip/lower extremity pain Baseline: 5 to 10 minutes Goal status: INITIAL           LONG TERM GOALS: Target date: 01/09/2023    Patient will report at least 75% improvement in overall symptoms and/or function to demonstrate improved functional mobility Baseline: 0% better Goal status: INITIAL   2.  Patient will be able to ambulate/stand for at least 60  minutes comfortably without left hip/lower extremity pain Baseline: 5 to 10 minutes Goal status: INITIAL   3.  Patient will report getting up every hour while working to reduce prolonged periods of time sitting down Baseline: Not currently Goal status: INITIAL       PLAN:   PT FREQUENCY: 1-2x/week 12 visits over 8 weeks certification period   PT DURATION: 8 weeks   PLANNED INTERVENTIONS: Therapeutic exercises, Therapeutic activity, Neuromuscular re-education, Balance training, Gait training, Patient/Family education, Self Care, Joint mobilization,  Joint manipulation, Stair training, Orthotic/Fit training, Aquatic Therapy, Dry Needling, Electrical stimulation, Spinal manipulation, Spinal mobilization, Cryotherapy, Moist heat, Ionotophoresis 4mg /ml Dexamethasone, Manual therapy, and Re-evaluation.   PLAN FOR NEXT SESSION: review DL leg lower (core),  hip ROM - prone, STM, neural stretches, breathwork/core for swayback posture  11:00 AM, 12/25/22 [name], DPT Physical Therapy with [facility]

## 2022-12-31 ENCOUNTER — Encounter: Payer: Self-pay | Admitting: Physical Therapy

## 2022-12-31 ENCOUNTER — Ambulatory Visit: Payer: 59 | Admitting: Physical Therapy

## 2022-12-31 DIAGNOSIS — M6281 Muscle weakness (generalized): Secondary | ICD-10-CM | POA: Diagnosis not present

## 2022-12-31 DIAGNOSIS — M5459 Other low back pain: Secondary | ICD-10-CM

## 2022-12-31 NOTE — Therapy (Signed)
OUTPATIENT PHYSICAL THERAPY TREATMENT NOTE   Patient Name: Isabella Barrett MRN: [id number] DOB:January 02, 1966, 57 y.o., female Today's Date: 12/31/2022  PCP: Willow Ora, MD   REFERRING PROVIDER: Willow Ora, MD  END OF SESSION:   PT End of Session - 12/31/22 1059     Visit Number 6    Number of Visits 12    Date for PT Re-Evaluation 01/09/23    Authorization Type aetna vL med [place]    PT Start Time 1100    PT Stop Time 1142    PT Time Calculation (min) 42 min    Activity Tolerance Patient tolerated treatment well    Behavior During Therapy [facility] for tasks assessed/performed              Past Medical History:  Diagnosis Date   Allergy    Anxiety    Bulging lumbar disc 12/16/2022   Symptomatic L5-S1 by MRI 2024; mild central canal stenosis Gabapentin and PT   Depression    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   Quit 07/2022   Past Surgical History:  Procedure Laterality Date   APPENDECTOMY     Patient Active Problem List   Diagnosis Date Noted   Bulging lumbar disc 12/16/2022   Anaphylactic reaction to bee sting 12/12/2021   GAD (generalized anxiety disorder) 05/01/2007   Perennial allergic rhinitis 05/01/2007   Recurrent genital herpes 05/01/2007      THERAPY DIAG:  Other low back pain  Muscle weakness (generalized)    REFERRING DIAG: M54.10 (ICD-10-CM) - Radiculopathy, unspecified spinal region   Rationale for Evaluation and Treatment: Rehabilitation   ONSET DATE: Since Christmas 2023   SUBJECTIVE:                                                                                                                                                                                            SUBJECTIVE STATEMENT: 12/31/2022 States symptoms come and go. States that when she does her exercises she has pain in her pelvis   Eval: States that if she stands or walks for a period of time she gets a pain in her low back and into her right  leg. States that her feet stay asleep/numb states this is not new but increased numbness on the right. States if she sits down her symptoms go away. Sometimes she has pain with sleeping if she moves wrong. States she hasn't tried riding a bike yet. States she was doing a lot of sitting in the fall as she was doing a lot of Production designer, theatre/television/film and is the only thing she  can think of that causes this.  Reports she feels like she can walk about 5-10 minutes before symptoms come on.    Symptoms are not as bad in the morning.    PERTINENT HISTORY:  NA   PAIN:  Are you having pain? Yes: NPRS scale: 0/10 Pain location: right low back and into right leg Pain description: shooting down entire leg  Aggravating factors: walking  Relieving factors: sitting    PRECAUTIONS: None   WEIGHT BEARING RESTRICTIONS: No   FALLS:  Has patient fallen in last 6 months? No     OCCUPATION: online retail business, likes to walk, swim, stretch   PLOF: Independent   PATIENT GOALS: too have less pain     OBJECTIVE:    DIAGNOSTIC FINDINGS:  MRI 10/25/22 IMPRESSION: 1. Mild spinal canal stenosis at L4-L5 due to combination of disc bulge and facet arthrosis. 2. L5-S1 right lateral recess stenosis which may be a source of right S1 radiculopathy. 3. Small medially projecting synovial cyst from the right L5-S1 facet joint measuring 5 x 2 mm.       SCREENING FOR RED FLAGS: Bowel or bladder incontinence: No Spinal tumors: No Cauda equina syndrome: No Compression fracture: No Abdominal aneurysm: No   COGNITION: Overall cognitive status: Within functional limits for tasks assessed                          SENSATION: Not tested     POSTURE: rounded shoulders, forward head, and swayback   PALPATION: Increased resting tone in the right glutes and piriformis, tenderness along right lumbar paraspinals         LE Measurements       Lower Extremity Right 11/14/2022 Left 11/14/2022    A/PROM MMT A/PROM MMT   Hip Flexion [facility]   WFL    Hip Extension 15 3 15  3+  Hip Abduction          Hip Adduction          Hip Internal rotation 35   60    Hip External rotation 40   60    Knee Flexion          Knee Extension          Ankle Dorsiflexion          Ankle Plantarflexion          Ankle Inversion          Ankle Eversion           (Blank rows = not tested) * pain     LUMBAR SPECIAL TESTS:    + femoral neural tension R  Neg SLR B     TODAY'S TREATMENT:                                                                                                                              DATE:   12/31/2022 Therapeutic Exercise:  quad: plank x20 5" holds Seated:   S/l: clamshells x15 B Supine: x2 30" holds both in IR and ER and B, pelvic tilts 1 minute, SL and DL leg lifts - focus on form - breathing and core deflating - self chosen exercise.    Standing: sumo squat REVEIWED   Neuromuscular Re-education: long exhale breathing - focus on CORE DEFLATING then zippering to pelvis 15 minutes Manual Therapy: Therapeutic Activity: Self Care: Trigger Point Dry Needling:  Modalities:        PATIENT EDUCATION:  Education details: on HEP, on rationale behind exercises, on body awareness and changing movement behaviors  Education method: Explanation, Demonstration, and Handouts Education comprehension: verbalized understanding     HOME EXERCISE PROGRAM: [id number]   ASSESSMENT:   CLINICAL IMPRESSION: 12/31/2022 Patient continues to perform SL and DL lifts at home despite discussion about stopping these has she has a tendency to hold breath and have pain with them. Reviewed and practiced breathing exercises with core activation and initially patient has tendency lift butt and engage pelvic muscles instead of core. Educated patient on this and focusing on isolated core work moving forward. No pain with exercises when performed correctly.   Eval: Patient presents to physical therapy with right low back and  lower extremity pain that is worse with walking around for more than 10 minutes at a time.  Patient presents with range of motion, strength, and postural limitations that are likely contributing to current presentation.  Patient spends most of the time sitting in a chair at work, educated patient of importance of movement throughout the day to reduce muscular neural tightness.  Patient would greatly benefit from skilled physical therapy to reduce overall physical limitations and improve overall mobility and quality of life.   OBJECTIVE IMPAIRMENTS: decreased activity tolerance, decreased mobility, difficulty walking, decreased ROM, decreased strength, postural dysfunction, and pain.    ACTIVITY LIMITATIONS: sleeping, stairs, and locomotion level   PARTICIPATION LIMITATIONS: cleaning and community activity   PERSONAL FACTORS: Fitness and Profession are also affecting patient's functional outcome.    REHAB POTENTIAL: Good   CLINICAL DECISION MAKING: Stable/uncomplicated   EVALUATION COMPLEXITY: Low     GOALS: Goals reviewed with patient? yes   SHORT TERM GOALS: Target date: 12/12/2022  Patient will be independent in self management strategies to improve quality of life and functional outcomes. Baseline: New Program Goal status: INITIAL   2.  Patient will report at least 50% improvement in overall symptoms and/or function to demonstrate improved functional mobility Baseline: 0% better Goal status: INITIAL   3.  Patient will be able to ambulate for at least 30 minutes comfortably without left hip/lower extremity pain Baseline: 5 to 10 minutes Goal status: INITIAL           LONG TERM GOALS: Target date: 01/09/2023    Patient will report at least 75% improvement in overall symptoms and/or function to demonstrate improved functional mobility Baseline: 0% better Goal status: INITIAL   2.  Patient will be able to ambulate/stand for at least 60 minutes comfortably without left  hip/lower extremity pain Baseline: 5 to 10 minutes Goal status: INITIAL   3.  Patient will report getting up every hour while working to reduce prolonged periods of time sitting down Baseline: Not currently Goal status: INITIAL       PLAN:   PT FREQUENCY: 1-2x/week 12 visits over 8 weeks certification period   PT DURATION: 8 weeks   PLANNED INTERVENTIONS: Therapeutic exercises, Therapeutic activity, Neuromuscular re-education, Balance  training, Gait training, Patient/Family education, Self Care, Joint mobilization, Joint manipulation, Stair training, Orthotic/Fit training, Aquatic Therapy, Dry Needling, Electrical stimulation, Spinal manipulation, Spinal mobilization, Cryotherapy, Moist heat, Ionotophoresis 4mg /ml Dexamethasone, Manual therapy, and Re-evaluation.   PLAN FOR NEXT SESSION: PN  11:44 AM, 12/31/22 [name], DPT Physical Therapy with [place]

## 2023-01-07 ENCOUNTER — Encounter: Payer: Self-pay | Admitting: Physical Therapy

## 2023-01-07 ENCOUNTER — Ambulatory Visit: Payer: 59 | Admitting: Physical Therapy

## 2023-01-07 DIAGNOSIS — M5459 Other low back pain: Secondary | ICD-10-CM | POA: Diagnosis not present

## 2023-01-07 DIAGNOSIS — M6281 Muscle weakness (generalized): Secondary | ICD-10-CM

## 2023-01-07 NOTE — Therapy (Signed)
OUTPATIENT PHYSICAL THERAPY TREATMENT NOTE Progress Note Reporting Period 11/14/22 to 01/07/23  See note below for Objective Data and Assessment of Progress/Goals.      Patient Name: Isabella Barrett MRN: [id number] DOB:03-Oct-1965, 57 y.o., female Today's Date: 01/07/2023  PCP: Willow Ora, MD   REFERRING PROVIDER: Willow Ora, MD  END OF SESSION:   PT End of Session - 01/07/23 1102     Visit Number 7    Number of Visits 13    Date for PT Re-Evaluation 04/01/23    Authorization Type aetna vL med [place]    PT Start Time 1103    PT Stop Time 1145    PT Time Calculation (min) 42 min    Activity Tolerance Patient tolerated treatment well    Behavior During Therapy [facility] for tasks assessed/performed               Past Medical History:  Diagnosis Date   Allergy    Anxiety    Bulging lumbar disc 12/16/2022   Symptomatic L5-S1 by MRI 2024; mild central canal stenosis Gabapentin and PT   Depression    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   Quit 07/2022   Past Surgical History:  Procedure Laterality Date   APPENDECTOMY     Patient Active Problem List   Diagnosis Date Noted   Bulging lumbar disc 12/16/2022   Anaphylactic reaction to bee sting 12/12/2021   GAD (generalized anxiety disorder) 05/01/2007   Perennial allergic rhinitis 05/01/2007   Recurrent genital herpes 05/01/2007      THERAPY DIAG:  Other low back pain  Muscle weakness (generalized)    REFERRING DIAG: M54.10 (ICD-10-CM) - Radiculopathy, unspecified spinal region   Rationale for Evaluation and Treatment: Rehabilitation   ONSET DATE: Since Christmas 2023   SUBJECTIVE:                                                                                                                                                                                            SUBJECTIVE STATEMENT: 01/07/2023 States she is feeling pretty good been doing her exercises. States she has symptoms  and they come and go. States she can do her exercise and they help. States she is no longer having the constant numbness  Eval: States that if she stands or walks for a period of time she gets a pain in her low back and into her right leg. States that her feet stay asleep/numb states this is not new but increased numbness on the right. States if she sits down her symptoms go away. Sometimes she has pain  with sleeping if she moves wrong. States she hasn't tried riding a bike yet. States she was doing a lot of sitting in the fall as she was doing a lot of online stuff and is the only thing she can think of that causes this.  Reports she feels like she can walk about 5-10 minutes before symptoms come on.    Symptoms are not as bad in the morning.    PERTINENT HISTORY:  NA   PAIN:  Are you having pain? Yes: NPRS scale: 0/10 Pain location: right low back and into right leg Pain description: shooting down entire leg  Aggravating factors: walking  Relieving factors: sitting    PRECAUTIONS: None   WEIGHT BEARING RESTRICTIONS: No   FALLS:  Has patient fallen in last 6 months? No     OCCUPATION: online retail business, likes to walk, swim, stretch   PLOF: Independent   PATIENT GOALS: too have less pain     OBJECTIVE:    DIAGNOSTIC FINDINGS:  MRI 10/25/22 IMPRESSION: 1. Mild spinal canal stenosis at L4-L5 due to combination of disc bulge and facet arthrosis. 2. L5-S1 right lateral recess stenosis which may be a source of right S1 radiculopathy. 3. Small medially projecting synovial cyst from the right L5-S1 facet joint measuring 5 x 2 mm.      LE Measurements       Lower Extremity Right 01/07/23 Left 01/07/23    A/PROM MMT A/PROM MMT  Hip Flexion WFL   WFL    Hip Extension 15 4- 15 4  Hip Abduction          Hip Adduction          Hip Internal rotation 30   60    Hip External rotation 40   60    Knee Flexion          Knee Extension          Ankle Dorsiflexion           Ankle Plantarflexion          Ankle Inversion          Ankle Eversion           (Blank rows = not tested) * pain     LUMBAR SPECIAL TESTS: 01/07/23    ELY'S TEST + R + femoral neural tension R        TODAY'S TREATMENT:                                                                                                                              DATE:   01/07/2023 Therapeutic Exercise:    quad:   Seated:   S/l:   Supine:hip IR 3 minutes - alternating  Neuromuscular Re-education: long exhale breathing - focus on CORE DEFLATING then zippering to pelvis 5 minutes, POST TILT with exercises for core engagement and cues to breath- marching 3 minutes, alternating bent knee fall out 2 minutes,  supine 90/90 with with shoulder flexion 4x5 B Manual Therapy: Therapeutic Activity: Self Care: Trigger Point Dry Needling:  Modalities:        PATIENT EDUCATION:  Education details: on HEP, on progress made, on POC moving forward. On typical healing/rehab process Education method: Explanation, Demonstration, and Handouts Education comprehension: verbalized understanding     HOME EXERCISE PROGRAM: [id number]   ASSESSMENT:   CLINICAL IMPRESSION: 01/07/2023 Progress note performed on this date. Patient is improving in function and strength but continues to have symptoms in legs at time but it is no longer constant and not as frequent. Reviewed HEP and progressed exercises adding additional core strengthening exercises which were tolerated well. Extending POC to continue to work on continued limitations and improve overall function and QOL.  Eval: Patient presents to physical therapy with right low back and lower extremity pain that is worse with walking around for more than 10 minutes at a time.  Patient presents with range of motion, strength, and postural limitations that are likely contributing to current presentation.  Patient spends most of the time sitting in a chair at work, educated patient  of importance of movement throughout the day to reduce muscular neural tightness.  Patient would greatly benefit from skilled physical therapy to reduce overall physical limitations and improve overall mobility and quality of life.   OBJECTIVE IMPAIRMENTS: decreased activity tolerance, decreased mobility, difficulty walking, decreased ROM, decreased strength, postural dysfunction, and pain.    ACTIVITY LIMITATIONS: sleeping, stairs, and locomotion level   PARTICIPATION LIMITATIONS: cleaning and community activity   PERSONAL FACTORS: Fitness and Profession are also affecting patient's functional outcome.    REHAB POTENTIAL: Good   CLINICAL DECISION MAKING: Stable/uncomplicated   EVALUATION COMPLEXITY: Low     GOALS: Goals reviewed with patient? yes   SHORT TERM GOALS: Target date: 12/12/2022  Patient will be independent in self management strategies to improve quality of life and functional outcomes. Baseline: New Program Goal status: MET   2.  Patient will report at least 50% improvement in overall symptoms and/or function to demonstrate improved functional mobility Baseline: 0% better Goal status: PROGRESSING- 20-40%    3.  Patient will be able to ambulate for at least 30 minutes comfortably without left hip/lower extremity pain Baseline: 5 to 10 minutes Goal status: PROGRESSING- 10-30 minutes -currently 01/07/23          LONG TERM GOALS: Target date: 01/09/2023    Patient will report at least 75% improvement in overall symptoms and/or function to demonstrate improved functional mobility Baseline: 0% better Goal status: PROGRESSING- 20-40%    2.  Patient will be able to ambulate/stand for at least 60 minutes comfortably without left hip/lower extremity pain Baseline: 5 to 10 minutes Goal status: PROGRESSING- 10-30 minutes -currently 01/07/23   3.  Patient will report getting up every hour while working to reduce prolonged periods of time sitting down Baseline: Not  currently Goal status: PROGRESSING- SOMETIMES SITS A FEW HOURS BUT THINKS ABOUT GETTING UP       PLAN:   PT FREQUENCY: 6 visits over the course of 12 weeks    PT DURATION: 12 weeks   PLANNED INTERVENTIONS: Therapeutic exercises, Therapeutic activity, Neuromuscular re-education, Balance training, Gait training, Patient/Family education, Self Care, Joint mobilization, Joint manipulation, Stair training, Orthotic/Fit training, Aquatic Therapy, Dry Needling, Electrical stimulation, Spinal manipulation, Spinal mobilization, Cryotherapy, Moist heat, Ionotophoresis 4mg /ml Dexamethasone, Manual therapy, and Re-evaluation.   PLAN FOR NEXT SESSION: review and update HEP - progress core and LE  program  11:48 AM, 01/07/23 [name], DPT Physical Therapy with [facility]

## 2023-01-17 ENCOUNTER — Other Ambulatory Visit (HOSPITAL_COMMUNITY): Payer: Self-pay

## 2023-02-05 ENCOUNTER — Encounter: Payer: Self-pay | Admitting: Physical Therapy

## 2023-02-05 ENCOUNTER — Ambulatory Visit: Payer: 59 | Admitting: Physical Therapy

## 2023-02-05 DIAGNOSIS — M5459 Other low back pain: Secondary | ICD-10-CM

## 2023-02-05 DIAGNOSIS — M6281 Muscle weakness (generalized): Secondary | ICD-10-CM

## 2023-02-05 NOTE — Therapy (Signed)
OUTPATIENT PHYSICAL THERAPY TREATMENT NOTE      Patient Name: Isabella Barrett MRN: [id number] DOB:[birth year], 57 y.o., female Today's Date: 02/05/2023  PCP: Willow Ora, MD   REFERRING PROVIDER: Willow Ora, MD  END OF SESSION:   PT End of Session - 02/05/23 1015     Visit Number 8    Number of Visits 13    Date for PT Re-Evaluation 04/01/23    Authorization Type aetna vL med [place]    PT Start Time 1019    PT Stop Time 1057    PT Time Calculation (min) 38 min    Activity Tolerance Patient tolerated treatment well    Behavior During Therapy [facility] for tasks assessed/performed               Past Medical History:  Diagnosis Date   Allergy    Anxiety    Bulging lumbar disc 12/16/2022   Symptomatic L5-S1 by MRI 2024; mild central canal stenosis Gabapentin and PT   Depression    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   Quit 07/2022   Past Surgical History:  Procedure Laterality Date   APPENDECTOMY     Patient Active Problem List   Diagnosis Date Noted   Bulging lumbar disc 12/16/2022   Anaphylactic reaction to bee sting 12/12/2021   GAD (generalized anxiety disorder) 05/01/2007   Perennial allergic rhinitis 05/01/2007   Recurrent genital herpes 05/01/2007      THERAPY DIAG:  Other low back pain  Muscle weakness (generalized)    REFERRING DIAG: M54.10 (ICD-10-CM) - Radiculopathy, unspecified spinal region   Rationale for Evaluation and Treatment: Rehabilitation   ONSET DATE: Since Christmas 2023   SUBJECTIVE:                                                                                                                                                                                            SUBJECTIVE STATEMENT: 02/05/2023 States the pain is less intense and less frequent but still occurs. States she has that deep pain in her bones randomly.  Eval: States that if she stands or walks for a period of time she gets a pain in  her low back and into her right leg. States that her feet stay asleep/numb states this is not new but increased numbness on the right. States if she sits down her symptoms go away. Sometimes she has pain with sleeping if she moves wrong. States she hasn't tried riding a bike yet. States she was doing a lot of sitting in the fall as she was doing a lot of online  stuff and is the only thing she can think of that causes this.  Reports she feels like she can walk about 5-10 minutes before symptoms come on.    Symptoms are not as bad in the morning.    PERTINENT HISTORY:  NA   PAIN:  Are you having pain? Yes: NPRS scale: 0/10 Pain location: right low back and into right leg Pain description: shooting down entire leg  Aggravating factors: walking  Relieving factors: sitting    PRECAUTIONS: None   WEIGHT BEARING RESTRICTIONS: No   FALLS:  Has patient fallen in last 6 months? No     OCCUPATION: online retail business, likes to walk, swim, stretch   PLOF: Independent   PATIENT GOALS: too have less pain     OBJECTIVE:    DIAGNOSTIC FINDINGS:  MRI 10/25/22 IMPRESSION: 1. Mild spinal canal stenosis at L4-L5 due to combination of disc bulge and facet arthrosis. 2. L5-S1 right lateral recess stenosis which may be a source of right S1 radiculopathy. 3. Small medially projecting synovial cyst from the right L5-S1 facet joint measuring 5 x 2 mm.      LE Measurements       Lower Extremity Right 01/07/23 Left 01/07/23    A/PROM MMT A/PROM MMT  Hip Flexion WFL   WFL    Hip Extension 15 4- 15 4  Hip Abduction          Hip Adduction          Hip Internal rotation 30   60    Hip External rotation 40   60    Knee Flexion          Knee Extension          Ankle Dorsiflexion          Ankle Plantarflexion          Ankle Inversion          Ankle Eversion           (Blank rows = not tested) * pain     LUMBAR SPECIAL TESTS: 01/07/23    ELY'S TEST + R + femoral neural tension R         TODAY'S TREATMENT:                                                                                                                              DATE:   02/05/2023 Therapeutic Exercise:    quad:  pelvic rocking hips abd x20 5-10" holds  Seated:   piriformis stretch x3 30" holds  IR ER R  S/l:   Supine:hip IR 3 minutes - alternating, piriformis stretch x3 30" holds B IR and ER  Neuromuscular Re-education: long exhale breathing - focus on CORE DEFLATING then zippering to pelvis 3 minutes, supine 90/90 with with shoulder flexion 4x5 B, quad pelvic tilts - tactile cues 4 minutes, alt arm raises over ball x15 B -tactile and verbal cues then  no ball x20 B    Manual Therapy: Therapeutic Activity: Self Care: Trigger Point Dry Needling:  Modalities:        PATIENT EDUCATION:  Education details: on HEP, modified order of HEP Education method: Explanation, Demonstration, and Handouts Education comprehension: verbalized understanding     HOME EXERCISE PROGRAM: [id number]   ASSESSMENT:   CLINICAL IMPRESSION: 02/05/2023 Reviewed HEP and answered all questions. Progressed as able. Breathing exercises still difficult for patient but improved compared to last session. Increased right leg numbness with quad over ball but this reduced with more active positioning. Tolerated new exercises well, slight increase in numbness noted after session but gradually reduced after piriformis stretch. Will continue with current POC as tolerated.   Eval: Patient presents to physical therapy with right low back and lower extremity pain that is worse with walking around for more than 10 minutes at a time.  Patient presents with range of motion, strength, and postural limitations that are likely contributing to current presentation.  Patient spends most of the time sitting in a chair at work, educated patient of importance of movement throughout the day to reduce muscular neural tightness.  Patient would greatly benefit  from skilled physical therapy to reduce overall physical limitations and improve overall mobility and quality of life.   OBJECTIVE IMPAIRMENTS: decreased activity tolerance, decreased mobility, difficulty walking, decreased ROM, decreased strength, postural dysfunction, and pain.    ACTIVITY LIMITATIONS: sleeping, stairs, and locomotion level   PARTICIPATION LIMITATIONS: cleaning and community activity   PERSONAL FACTORS: Fitness and Profession are also affecting patient's functional outcome.    REHAB POTENTIAL: Good   CLINICAL DECISION MAKING: Stable/uncomplicated   EVALUATION COMPLEXITY: Low     GOALS: Goals reviewed with patient? yes   SHORT TERM GOALS: Target date: 12/12/2022  Patient will be independent in self management strategies to improve quality of life and functional outcomes. Baseline: New Program Goal status: MET   2.  Patient will report at least 50% improvement in overall symptoms and/or function to demonstrate improved functional mobility Baseline: 0% better Goal status: PROGRESSING- 20-40%    3.  Patient will be able to ambulate for at least 30 minutes comfortably without left hip/lower extremity pain Baseline: 5 to 10 minutes Goal status: PROGRESSING- 10-30 minutes -currently 01/07/23          LONG TERM GOALS: Target date: 01/09/2023    Patient will report at least 75% improvement in overall symptoms and/or function to demonstrate improved functional mobility Baseline: 0% better Goal status: PROGRESSING- 20-40%    2.  Patient will be able to ambulate/stand for at least 60 minutes comfortably without left hip/lower extremity pain Baseline: 5 to 10 minutes Goal status: PROGRESSING- 10-30 minutes -currently 01/07/23   3.  Patient will report getting up every hour while working to reduce prolonged periods of time sitting down Baseline: Not currently Goal status: PROGRESSING- SOMETIMES SITS A FEW HOURS BUT THINKS ABOUT GETTING UP       PLAN:   PT  FREQUENCY: 6 visits over the course of 12 weeks    PT DURATION: 12 weeks   PLANNED INTERVENTIONS: Therapeutic exercises, Therapeutic activity, Neuromuscular re-education, Balance training, Gait training, Patient/Family education, Self Care, Joint mobilization, Joint manipulation, Stair training, Orthotic/Fit training, Aquatic Therapy, Dry Needling, Electrical stimulation, Spinal manipulation, Spinal mobilization, Cryotherapy, Moist heat, Ionotophoresis 4mg /ml Dexamethasone, Manual therapy, and Re-evaluation.   PLAN FOR NEXT SESSION: review and update HEP - progress core and LE program  11:20 AM, 02/05/23 [name], DPT  Physical Therapy with [place]

## 2023-02-06 ENCOUNTER — Ambulatory Visit
Admission: RE | Admit: 2023-02-06 | Discharge: 2023-02-06 | Disposition: A | Discharge status: Home / Self Care | Payer: 59 | Source: Ambulatory Visit | Attending: Family Medicine | Admitting: Family Medicine

## 2023-02-06 DIAGNOSIS — Z1231 Encounter for screening mammogram for malignant neoplasm of breast: Secondary | ICD-10-CM

## 2023-02-14 ENCOUNTER — Other Ambulatory Visit (HOSPITAL_COMMUNITY): Payer: Self-pay

## 2023-02-21 ENCOUNTER — Other Ambulatory Visit (HOSPITAL_COMMUNITY): Payer: Self-pay

## 2023-03-05 ENCOUNTER — Encounter: Payer: Self-pay | Admitting: Physical Therapy

## 2023-03-05 ENCOUNTER — Ambulatory Visit: Payer: 59 | Admitting: Physical Therapy

## 2023-03-05 DIAGNOSIS — M6281 Muscle weakness (generalized): Secondary | ICD-10-CM | POA: Diagnosis not present

## 2023-03-05 DIAGNOSIS — M5459 Other low back pain: Secondary | ICD-10-CM

## 2023-03-05 NOTE — Therapy (Addendum)
 OUTPATIENT PHYSICAL THERAPY TREATMENT NOTE  PHYSICAL THERAPY DISCHARGE SUMMARY  Visits from Start of Care: 9  Current functional level related to goals / functional outcomes: Could not reassess due to unplanned discharged    Remaining deficits: Could not reassess due to unplanned discharged    Education / Equipment: Could not reassess due to unplanned discharged    Patient agrees to discharge. Patient goals were partially met. Patient is being discharged due to not returning since the last visit.  9:04 AM, 10/08/23 Isabella Barrett, DPT Physical Therapy with [place]     Patient Name: Isabella Barrett MRN: [id number] DOB:[birth year], 57 y.o., female Today's Date: 03/05/2023  PCP: Willow Ora, MD   REFERRING PROVIDER: Willow Ora, MD  END OF SESSION:   PT End of Session - 03/05/23 1023     Visit Number 9    Number of Visits 13    Date for PT Re-Evaluation 04/01/23    Authorization Type aetna vL med [place]    PT Start Time 1023    PT Stop Time 1102    PT Time Calculation (min) 39 min    Activity Tolerance Patient tolerated treatment well    Behavior During Therapy [facility] for tasks assessed/performed               Past Medical History:  Diagnosis Date   Allergy    Anxiety    Bulging lumbar disc 12/16/2022   Symptomatic L5-S1 by MRI 2024; mild central canal stenosis Gabapentin and PT   Depression    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   Quit 07/2022   Past Surgical History:  Procedure Laterality Date   APPENDECTOMY     Patient Active Problem List   Diagnosis Date Noted   Bulging lumbar disc 12/16/2022   Anaphylactic reaction to bee sting 12/12/2021   GAD (generalized anxiety disorder) 05/01/2007   Perennial allergic rhinitis 05/01/2007   Recurrent genital herpes 05/01/2007      THERAPY DIAG:  Other low back pain  Muscle weakness (generalized)    REFERRING DIAG: M54.10 (ICD-10-CM) - Radiculopathy, unspecified  spinal region   Rationale for Evaluation and Treatment: Rehabilitation   ONSET DATE: Since Christmas 2023   SUBJECTIVE:                                                                                                                                                                                            SUBJECTIVE STATEMENT: 03/05/2023 States it has been a busy few weeks. States she wants to get better and she continues to have intermittent pain. Sometimes it takes  20 minutes to present and sometimes it takes 2 minutes to present.   Eval: States that if she stands or walks for a period of time she gets a pain in her low back and into her right leg. States that her feet stay asleep/numb states this is not new but increased numbness on the right. States if she sits down her symptoms go away. Sometimes she has pain with sleeping if she moves wrong. States she hasn't tried riding a bike yet. States she was doing a lot of sitting in the fall as she was doing a lot of online stuff and is the only thing she can think of that causes this.  Reports she feels like she can walk about 5-10 minutes before symptoms come on.    Symptoms are not as bad in the morning.    PERTINENT HISTORY:  NA   PAIN:  Are you having pain? Yes: NPRS scale: 0/10 Pain location: right low back and into right leg Pain description: shooting down entire leg  Aggravating factors: walking  Relieving factors: sitting    PRECAUTIONS: None   WEIGHT BEARING RESTRICTIONS: No   FALLS:  Has patient fallen in last 6 months? No     OCCUPATION: online retail business, likes to walk, swim, stretch   PLOF: Independent   PATIENT GOALS: too have less pain     OBJECTIVE:    DIAGNOSTIC FINDINGS:  MRI 10/25/22 IMPRESSION: 1. Mild spinal canal stenosis at L4-L5 due to combination of disc bulge and facet arthrosis. 2. L5-S1 right lateral recess stenosis which may be a source of right S1 radiculopathy. 3. Small medially  projecting synovial cyst from the right L5-S1 facet joint measuring 5 x 2 mm.      LE Measurements       Lower Extremity Right 01/07/23 Left 01/07/23    A/PROM MMT A/PROM MMT  Hip Flexion WFL   [facility]    Hip Extension 15 4- 15 4  Hip Abduction          Hip Adduction          Hip Internal rotation 30   60    Hip External rotation 40   60    Knee Flexion          Knee Extension          Ankle Dorsiflexion          Ankle Plantarflexion          Ankle Inversion          Ankle Eversion           (Blank rows = not tested) * pain     LUMBAR SPECIAL TESTS: 01/07/23    ELY'S TEST + R + femoral neural tension R        TODAY'S TREATMENT:  DATE:   03/05/2023 Therapeutic Exercise:    quad:    Seated:   piriformis stretch x3 30" holds  IR ER R  Prone: POE x5, hamstring curls x5 10" hlds B   Supine:piriformis stretch x3 30" holds B  ER  Neuromuscular Re-education:     Manual Therapy: IASTM with percussion gun and towel to lumbar muscles and glutes 20 minutes Therapeutic Activity: Self Care: Trigger Point Dry Needling:  Modalities:        PATIENT EDUCATION:  Education details: on HEP, on how to use percussion gun safely with towel for vibration and benefits.  Education method: Explanation, Demonstration, and Handouts Education comprehension: verbalized understanding     HOME EXERCISE PROGRAM: [id number]   ASSESSMENT:   CLINICAL IMPRESSION: 03/05/2023 Patient frustrated with continued pain. Discussed current symptoms and plan moving forward. Discussed f/u with MD to discuss next steps if pain persists. Tolerated percussion gun well after patient relaxed with treatment. Less symptoms noted end of session. Encouraged patient to use vibration gun at home with cushion so long as it is not painful. Will continue with current POC as tolerated.   Eval: Patient  presents to physical therapy with right low back and lower extremity pain that is worse with walking around for more than 10 minutes at a time.  Patient presents with range of motion, strength, and postural limitations that are likely contributing to current presentation.  Patient spends most of the time sitting in a chair at work, educated patient of importance of movement throughout the day to reduce muscular neural tightness.  Patient would greatly benefit from skilled physical therapy to reduce overall physical limitations and improve overall mobility and quality of life.   OBJECTIVE IMPAIRMENTS: decreased activity tolerance, decreased mobility, difficulty walking, decreased ROM, decreased strength, postural dysfunction, and pain.    ACTIVITY LIMITATIONS: sleeping, stairs, and locomotion level   PARTICIPATION LIMITATIONS: cleaning and community activity   PERSONAL FACTORS: Fitness and Profession are also affecting patient's functional outcome.    REHAB POTENTIAL: Good   CLINICAL DECISION MAKING: Stable/uncomplicated   EVALUATION COMPLEXITY: Low     GOALS: Goals reviewed with patient? yes   SHORT TERM GOALS: Target date: 12/12/2022  Patient will be independent in self management strategies to improve quality of life and functional outcomes. Baseline: New Program Goal status: MET   2.  Patient will report at least 50% improvement in overall symptoms and/or function to demonstrate improved functional mobility Baseline: 0% better Goal status: PROGRESSING- 20-40%    3.  Patient will be able to ambulate for at least 30 minutes comfortably without left hip/lower extremity pain Baseline: 5 to 10 minutes Goal status: PROGRESSING- 10-30 minutes -currently 01/07/23          LONG TERM GOALS: Target date: 01/09/2023    Patient will report at least 75% improvement in overall symptoms and/or function to demonstrate improved functional mobility Baseline: 0% better Goal status: PROGRESSING-  20-40%    2.  Patient will be able to ambulate/stand for at least 60 minutes comfortably without left hip/lower extremity pain Baseline: 5 to 10 minutes Goal status: PROGRESSING- 10-30 minutes -currently 01/07/23   3.  Patient will report getting up every hour while working to reduce prolonged periods of time sitting down Baseline: Not currently Goal status: PROGRESSING- SOMETIMES SITS A FEW HOURS BUT THINKS ABOUT GETTING UP       PLAN:   PT FREQUENCY: 6 visits over the course of 12 weeks    PT DURATION: 12  weeks   PLANNED INTERVENTIONS: Therapeutic exercises, Therapeutic activity, Neuromuscular re-education, Balance training, Gait training, Patient/Family education, Self Care, Joint mobilization, Joint manipulation, Stair training, Orthotic/Fit training, Aquatic Therapy, Dry Needling, Electrical stimulation, Spinal manipulation, Spinal mobilization, Cryotherapy, Moist heat, Ionotophoresis 4mg /ml Dexamethasone, Manual therapy, and Re-evaluation.   PLAN FOR NEXT SESSION: review and update HEP - progress core and LE program  11:15 AM, 03/05/23 Isabella Barrett, DPT Physical Therapy with [facility]

## 2023-03-13 ENCOUNTER — Encounter (INDEPENDENT_AMBULATORY_CARE_PROVIDER_SITE_OTHER): Payer: Self-pay

## 2023-03-15 ENCOUNTER — Other Ambulatory Visit (HOSPITAL_COMMUNITY): Payer: Self-pay

## 2023-03-17 ENCOUNTER — Other Ambulatory Visit (HOSPITAL_COMMUNITY): Payer: Self-pay

## 2023-03-17 ENCOUNTER — Other Ambulatory Visit: Payer: Self-pay | Admitting: Family Medicine

## 2023-03-17 MED ORDER — FLUOXETINE HCL 40 MG PO CAPS
40.0000 mg | ORAL_CAPSULE | Freq: Every day | ORAL | 3 refills | Status: DC
  Filled 2023-03-17: qty 30, 30d supply, fill #0
  Filled 2023-04-20: qty 30, 30d supply, fill #1
  Filled 2023-05-18: qty 30, 30d supply, fill #2
  Filled 2023-06-17: qty 30, 30d supply, fill #3
  Filled 2023-07-16: qty 30, 30d supply, fill #4
  Filled 2023-08-15: qty 30, 30d supply, fill #5
  Filled 2023-09-14: qty 30, 30d supply, fill #6
  Filled 2023-10-14: qty 30, 30d supply, fill #7
  Filled 2023-11-12: qty 30, 30d supply, fill #8
  Filled 2023-12-18: qty 30, 30d supply, fill #9
  Filled 2024-01-17: qty 30, 30d supply, fill #10
  Filled 2024-02-17: qty 30, 30d supply, fill #11

## 2023-03-18 ENCOUNTER — Other Ambulatory Visit (HOSPITAL_COMMUNITY): Payer: Self-pay

## 2023-04-02 ENCOUNTER — Encounter: Payer: 59 | Admitting: Physical Therapy

## 2023-04-09 ENCOUNTER — Encounter: Payer: Self-pay | Admitting: Family Medicine

## 2023-04-09 ENCOUNTER — Other Ambulatory Visit (HOSPITAL_COMMUNITY): Payer: Self-pay

## 2023-04-09 ENCOUNTER — Ambulatory Visit (INDEPENDENT_AMBULATORY_CARE_PROVIDER_SITE_OTHER): Payer: 59 | Admitting: Family Medicine

## 2023-04-09 ENCOUNTER — Other Ambulatory Visit (HOSPITAL_BASED_OUTPATIENT_CLINIC_OR_DEPARTMENT_OTHER): Payer: Self-pay

## 2023-04-09 VITALS — BP 100/50 | HR 66 | Temp 98.7°F | Ht 64.0 in | Wt 132.4 lb

## 2023-04-09 DIAGNOSIS — N951 Menopausal and female climacteric states: Secondary | ICD-10-CM | POA: Diagnosis not present

## 2023-04-09 DIAGNOSIS — F4321 Adjustment disorder with depressed mood: Secondary | ICD-10-CM

## 2023-04-09 DIAGNOSIS — M5416 Radiculopathy, lumbar region: Secondary | ICD-10-CM | POA: Diagnosis not present

## 2023-04-09 MED ORDER — GABAPENTIN 300 MG PO CAPS
300.0000 mg | ORAL_CAPSULE | Freq: Every day | ORAL | 1 refills | Status: DC
Start: 2023-04-09 — End: 2023-11-12
  Filled 2023-04-09: qty 30, 30d supply, fill #0
  Filled 2023-05-18: qty 30, 30d supply, fill #1
  Filled 2023-06-17: qty 30, 30d supply, fill #2
  Filled 2023-08-15: qty 30, 30d supply, fill #3
  Filled 2023-09-14: qty 30, 30d supply, fill #4
  Filled 2023-10-14: qty 30, 30d supply, fill #5

## 2023-04-09 NOTE — Progress Notes (Signed)
Subjective  CC:  Chief Complaint  Patient presents with   Leg Pain    Right leg pain    HPI: Isabella Barrett is a 57 y.o. female who presents to the office today to address the problems listed above in the chief complaint. [name] returns due to persistent pain.  We have been dealing with right low back pain with radicular symptoms that had improved.  She did have an MRI in April: IMPRESSION: (10/2022)  1. Mild spinal canal stenosis at L4-L5 due to combination of disc bulge and facet arthrosis.  2. L5-S1 right lateral recess stenosis which may be a source of right S1 radiculopathy.  3. Small medially projecting synovial cyst from the right L5-S1 facet joint measuring 5 x 2 mm.   However, now with pelvic girdle pain.  She describes aching.  Comes and goes.  Worse over the last several months.  Takes Advil or Tylenol at times.  At times gabapentin helps her sleep.  Also with menopausal vasomotor symptoms interrupting sleep.  She is overtired, sleep deprived and also becoming depressed because she does does not feel as well as she used to.  She denies true GI symptoms.  No nausea vomiting constipation or diarrhea.  No fevers or chills.  No bowel or bladder problems.  No weakness in the lower extremity.  She inquires about menopausal hormone treatment.  Assessment  1. Chronic right-sided lumbar radiculopathy   2. Menopausal vasomotor syndrome   3. Adjustment disorder with depressed mood      Plan  Chronic right-sided lumbar radiculopathy with pelvic pain: Symptoms ongoing for 9 months!  They had improved but seem to have worsened again.  Would like her to see sports medicine specialist to see if this is all related to the low back.  Restart gabapentin to help with pain, sleep and also menopausal hot flashes.  Can discuss further treatments for menopause after we clarify her back pain and get her feeling better.  We discussed her mood.  I think to see her back in the office for follow-up after  she sees sports medicine.  She will be going to [place] in October with her daughter.  Need to get her back pain under control so she can enjoy her trip.  Follow up: 6 to 8 weeks for follow-up Visit date not found  Orders Placed This Encounter  Procedures   Ambulatory referral to Sports Medicine   Meds ordered this encounter  Medications   gabapentin (NEURONTIN) 300 MG capsule    Sig: Take 1 capsule (300 mg total) by mouth at bedtime.    Dispense:  90 capsule    Refill:  1      I reviewed the patients updated PMH, FH, and SocHx.    Patient Active Problem List   Diagnosis Date Noted   Chronic right-sided lumbar radiculopathy 12/16/2022   Anaphylactic reaction to bee sting 12/12/2021   GAD (generalized anxiety disorder) 05/01/2007   Perennial allergic rhinitis 05/01/2007   Recurrent genital herpes 05/01/2007   Current Meds  Medication Sig   EPIPEN 2-PAK 0.3 MG/0.3ML SOAJ injection Use in the event of a bee sting allergy.   famciclovir (FAMVIR) 250 MG tablet Take 1 tablet (250 mg total) by mouth 2 (two) times daily.   FLUoxetine (PROZAC) 40 MG capsule Take 1 capsule (40 mg total) by mouth daily.    Allergies: Patient is allergic to bee venom, sulfa antibiotics, and sulfonamide derivatives. Family History: Patient family history includes Cancer in  her father. Social History:  Patient  reports that she has never smoked. She does not have any smokeless tobacco history on file. She reports that she does not use drugs.  Review of Systems: Constitutional: Negative for fever malaise or anorexia Cardiovascular: negative for chest pain Respiratory: negative for SOB or persistent cough Gastrointestinal: negative for abdominal pain  Objective  Vitals: BP (!) 100/50   Pulse 66   Temp 98.7 F (37.1 C)   Ht 5\' 4"  (1.626 m)   Wt 132 lb 6.4 oz (60.1 kg)   SpO2 96%   BMI 22.73 kg/m  General: no acute distress , A&Ox3 Affect: Worried mildly down HEENT: PEERL, conjunctiva normal,  neck is supple Cardiovascular:  RRR without murmur or gallop.  Respiratory:  Good breath sounds bilaterally, CTAB with normal respiratory effort Gastrointestinal: soft, flat abdomen, normal active bowel sounds, no palpable masses, no hepatosplenomegaly, no appreciated hernias Nontender Back: Negative straight leg raise bilaterally Skin:  Warm, no rashes  Commons side effects, risks, benefits, and alternatives for medications and treatment plan prescribed today were discussed, and the patient expressed understanding of the given instructions. Patient is instructed to call or message via MyChart if he/she has any questions or concerns regarding our treatment plan. No barriers to understanding were identified. We discussed Red Flag symptoms and signs in detail. Patient expressed understanding regarding what to do in case of urgent or emergency type symptoms.  Medication list was reconciled, printed and provided to the patient in AVS. Patient instructions and summary information was reviewed with the patient as documented in the AVS. This note was prepared with assistance of Dragon voice recognition software. Occasional wrong-word or sound-a-like substitutions may have occurred due to the inherent limitations of voice recognition software

## 2023-04-11 ENCOUNTER — Ambulatory Visit: Payer: 59 | Admitting: Sports Medicine

## 2023-04-11 VITALS — BP 102/50 | HR 63 | Ht 64.0 in | Wt 132.0 lb

## 2023-04-11 DIAGNOSIS — M5416 Radiculopathy, lumbar region: Secondary | ICD-10-CM | POA: Diagnosis not present

## 2023-04-11 NOTE — Progress Notes (Signed)
    Isabella Barrett D.[facility] [address] [place] [address] Phone: [phone]   Assessment and Plan:     1. Chronic right-sided lumbar radiculopathy -Chronic with exacerbation, initial sports medicine visit - Low back pain with right-sided radicular symptoms ongoing for 9+ months that is consistent with L5-S1 right lateral recess stenosis and right-sided S1 radiculopathy - May continue gabapentin as needed for treatment of neurologic pain - Patient elected for epidural CSI at right-sided L5-S1.  Order placed today - Patient's goal is to be able to enjoy your trip in mid October. - May continue physical activity as tolerated.  Recommend elliptical, stationary bike, water aerobics as activities that eliminate foot strike   Pertinent previous records reviewed include lumbar MRI 10/25/2022   Follow Up: 2 weeks after epidural to review results and discuss treatment plan.  Could consider additional epidural versus NSAID course based on improvement   Subjective:   I, Isabella Barrett, am serving as a Neurosurgeon for Doctor Richardean Sale  Chief Complaint: low back pain   HPI:   04/11/23 Patient is a 57 year old female complaining of low back pain. Patient states that she has been in pain since christmas. No MOI. Low back pain that radiates down her leg. Aleve for the pain and that takes the edge off. She has pain when walking or standing. She states she has a pain in her lower body that is in her bones that is intermittent. MRI from april  Relevant Historical Information: None pertinent  Additional pertinent review of systems negative.   Current Outpatient Medications:    EPIPEN 2-PAK 0.3 MG/0.3ML SOAJ injection, Use in the event of a bee sting allergy., Disp: 2 Device, Rfl: 1   famciclovir (FAMVIR) 250 MG tablet, Take 1 tablet (250 mg total) by mouth 2 (two) times daily., Disp: 180 tablet, Rfl: 3   FLUoxetine (PROZAC) 40 MG capsule, Take 1  capsule (40 mg total) by mouth daily., Disp: 90 capsule, Rfl: 3   gabapentin (NEURONTIN) 300 MG capsule, Take 1 capsule (300 mg total) by mouth at bedtime., Disp: 90 capsule, Rfl: 1   Objective:     Vitals:   04/11/23 0906  BP: (!) 102/50  Pulse: 63  SpO2: 96%  Weight: 132 lb (59.9 kg)  Height: 5\' 4"  (1.626 m)      Body mass index is 22.66 kg/m.    Physical Exam:    Gen: Appears well, nad, nontoxic and pleasant Psych: Alert and oriented, appropriate mood and affect Neuro: sensation intact, strength is 5/5 in upper and lower extremities, muscle tone wnl Skin: no susupicious lesions or rashes  Back - Normal skin, Spine with normal alignment and no deformity.   No tenderness to vertebral process palpation.   Paraspinous muscles are not tender and without spasm NTTP gluteal musculature Straight leg raise positive right Trendelenberg positive right Piriformis Test negative for radicular symptoms, though generally tighter musculature on right side compared to left Gait normal    Electronically signed by:  Isabella Barrett D.[facility] 9:33 AM 04/11/23

## 2023-04-11 NOTE — Patient Instructions (Signed)
Epidural right L5-S1 Follow up 2 weeks after to discuss results

## 2023-04-22 NOTE — Discharge Instructions (Signed)

## 2023-04-23 ENCOUNTER — Ambulatory Visit
Admission: RE | Admit: 2023-04-23 | Discharge: 2023-04-23 | Disposition: A | Discharge status: Home / Self Care | Payer: 59 | Source: Ambulatory Visit | Attending: Sports Medicine | Admitting: Sports Medicine

## 2023-04-23 DIAGNOSIS — M5416 Radiculopathy, lumbar region: Secondary | ICD-10-CM

## 2023-04-23 DIAGNOSIS — M4727 Other spondylosis with radiculopathy, lumbosacral region: Secondary | ICD-10-CM | POA: Diagnosis not present

## 2023-04-23 MED ORDER — METHYLPREDNISOLONE ACETATE 40 MG/ML INJ SUSP (RADIOLOG
80.0000 mg | Freq: Once | INTRAMUSCULAR | Status: AC
  Administered 2023-04-23: 80 mg via EPIDURAL

## 2023-04-23 MED ORDER — IOPAMIDOL (ISOVUE-M 200) INJECTION 41%
1.0000 mL | Freq: Once | INTRAMUSCULAR | Status: AC
  Administered 2023-04-23: 1 mL via EPIDURAL

## 2023-09-14 ENCOUNTER — Other Ambulatory Visit (HOSPITAL_COMMUNITY): Payer: Self-pay

## 2023-10-14 ENCOUNTER — Other Ambulatory Visit (HOSPITAL_COMMUNITY): Payer: Self-pay

## 2023-11-12 ENCOUNTER — Other Ambulatory Visit: Payer: Self-pay | Admitting: Family Medicine

## 2023-11-12 MED ORDER — GABAPENTIN 300 MG PO CAPS
300.0000 mg | ORAL_CAPSULE | Freq: Every day | ORAL | 1 refills | Status: DC
  Filled 2023-11-12: qty 90, 90d supply, fill #0
  Filled 2023-12-18 – 2024-02-17 (×3): qty 90, 90d supply, fill #1

## 2023-11-13 ENCOUNTER — Other Ambulatory Visit (HOSPITAL_COMMUNITY): Payer: Self-pay

## 2023-12-14 ENCOUNTER — Other Ambulatory Visit: Payer: Self-pay | Admitting: Family Medicine

## 2023-12-15 ENCOUNTER — Other Ambulatory Visit (HOSPITAL_COMMUNITY): Payer: Self-pay

## 2023-12-15 MED ORDER — FAMCICLOVIR 250 MG PO TABS
250.0000 mg | ORAL_TABLET | Freq: Two times a day (BID) | ORAL | 3 refills | Status: AC
  Filled 2023-12-15: qty 60, 30d supply, fill #0
  Filled 2024-01-17: qty 60, 30d supply, fill #1
  Filled 2024-02-17: qty 60, 30d supply, fill #2
  Filled 2024-03-21: qty 60, 30d supply, fill #3
  Filled 2024-04-22: qty 60, 30d supply, fill #4

## 2023-12-16 ENCOUNTER — Other Ambulatory Visit (HOSPITAL_COMMUNITY): Payer: Self-pay

## 2023-12-18 ENCOUNTER — Other Ambulatory Visit (HOSPITAL_COMMUNITY): Payer: Self-pay

## 2023-12-18 ENCOUNTER — Other Ambulatory Visit: Payer: Self-pay

## 2023-12-23 ENCOUNTER — Other Ambulatory Visit (HOSPITAL_COMMUNITY): Payer: Self-pay

## 2023-12-31 ENCOUNTER — Other Ambulatory Visit (HOSPITAL_COMMUNITY): Payer: Self-pay

## 2023-12-31 ENCOUNTER — Encounter: Payer: Self-pay | Admitting: Family Medicine

## 2023-12-31 ENCOUNTER — Other Ambulatory Visit: Payer: Self-pay

## 2023-12-31 ENCOUNTER — Ambulatory Visit (INDEPENDENT_AMBULATORY_CARE_PROVIDER_SITE_OTHER): Admitting: Family Medicine

## 2023-12-31 VITALS — BP 117/65 | HR 57 | Temp 98.3°F | Ht 64.0 in | Wt 137.8 lb

## 2023-12-31 DIAGNOSIS — M5416 Radiculopathy, lumbar region: Secondary | ICD-10-CM

## 2023-12-31 DIAGNOSIS — Z0001 Encounter for general adult medical examination with abnormal findings: Secondary | ICD-10-CM | POA: Diagnosis not present

## 2023-12-31 DIAGNOSIS — Z9889 Other specified postprocedural states: Secondary | ICD-10-CM | POA: Diagnosis not present

## 2023-12-31 DIAGNOSIS — J3089 Other allergic rhinitis: Secondary | ICD-10-CM | POA: Diagnosis not present

## 2023-12-31 DIAGNOSIS — G5603 Carpal tunnel syndrome, bilateral upper limbs: Secondary | ICD-10-CM | POA: Diagnosis not present

## 2023-12-31 DIAGNOSIS — A6000 Herpesviral infection of urogenital system, unspecified: Secondary | ICD-10-CM

## 2023-12-31 DIAGNOSIS — L719 Rosacea, unspecified: Secondary | ICD-10-CM

## 2023-12-31 DIAGNOSIS — F411 Generalized anxiety disorder: Secondary | ICD-10-CM

## 2023-12-31 DIAGNOSIS — N951 Menopausal and female climacteric states: Secondary | ICD-10-CM

## 2023-12-31 DIAGNOSIS — L2082 Flexural eczema: Secondary | ICD-10-CM

## 2023-12-31 DIAGNOSIS — F329 Major depressive disorder, single episode, unspecified: Secondary | ICD-10-CM

## 2023-12-31 MED ORDER — METRONIDAZOLE 0.75 % EX CREA
TOPICAL_CREAM | Freq: Two times a day (BID) | CUTANEOUS | 3 refills | Status: AC
  Filled 2023-12-31: qty 45, 30d supply, fill #0

## 2023-12-31 MED ORDER — TRIAMCINOLONE ACETONIDE 0.1 % EX CREA
1.0000 | TOPICAL_CREAM | Freq: Two times a day (BID) | CUTANEOUS | 0 refills | Status: AC
  Filled 2023-12-31: qty 45, 23d supply, fill #0

## 2023-12-31 NOTE — Patient Instructions (Signed)
 Please return in 12 months for your annual complete physical; please come fasting. For follow up on chronic medical conditions   I will release your lab results to you on your MyChart account with further instructions. You may see the results before I do, but when I review them I will send you a message with my report or have my assistant call you if things need to be discussed. Please reply to my message with any questions. Thank you!   If you have any questions or concerns, please don't hesitate to send me a message via MyChart or call the office at [phone]. Thank you for visiting with us  today! It's our pleasure caring for you.    VISIT SUMMARY: Today, you had a complete physical and follow-up for your chronic conditions. We reviewed your current medications and discussed your ongoing symptoms and management plans.  YOUR PLAN: -CARPAL TUNNEL SYNDROME: Carpal Tunnel Syndrome is caused by compression of the median nerve in your wrist, leading to symptoms like numbness and tingling in your hands. To manage this, I recommend using wrist splints at night, taking anti-inflammatory medications for two weeks, and avoiding wrist flexion while driving.  -CHRONIC BACK AND HIP PAIN: Your chronic back and hip pain is currently well-managed with gabapentin  and physical therapy. Gabapentin  also helps with your perimenopausal symptoms. Continue taking gabapentin  and incorporate core and back strengthening exercises into your routine.  -PERIMENOPAUSAL SYMPTOMS: Perimenopausal symptoms, such as hot flashes, are being managed with gabapentin . Continue taking gabapentin  to help with nighttime hot flashes.  -ANXIETY AND DEPRESSION: Your mood and anxiety are well-controlled with Prozac . Continue taking 40 mg of Prozac  daily, and we will adjust the treatment if your symptoms worsen.  -HERPES SIMPLEX VIRUS (HSV): Herpes Simplex Virus causes recurrent outbreaks, which you are managing with Valtrex . Continue taking  Valtrex  as prescribed.  -DERMATITIS AND ROSACEA: Dermatitis and rosacea are skin conditions that can be exacerbated by scratching and stress. For dermatitis, apply the prescribed steroid cream 1-2 times daily for 1-2 weeks. For rosacea, apply the prescribed topical antibiotic at night.  -GENERAL HEALTH MAINTENANCE: Your routine health maintenance is up to date except for your mammogram and eye exam. Please schedule your mammogram for July and your annual eye exam.  INSTRUCTIONS: Please schedule a follow-up appointment in one year. Additionally, we will order basic lab work to monitor your health.                      Contains text generated by [employer]bridge.                                 Contains text generated by [employer].

## 2023-12-31 NOTE — Progress Notes (Signed)
 Subjective  Chief Complaint  Patient presents with   Annual Exam    Pt here for Annual Exam and is currently fasting     HPI: Isabella Barrett is a 58 y.o. female who presents to [facility] [place] today for a Female Wellness Visit. She also has the concerns and/or needs as listed above in the chief complaint. These will be addressed in addition to the Health Maintenance Visit.   Wellness Visit: annual visit with health maintenance review and exam  Health maintenance: Pap current, see below.  Mammo due in July.  Cologuard due next year Chronic disease f/u and/or acute problem visit: (deemed necessary to be done in addition to the wellness visit): Discussed the use of AI scribe software for clinical note transcription with the patient, who gave verbal consent to proceed.  History of Present Illness Isabella Barrett is a 58 year old female who presents for a complete physical and follow-up of chronic problems.  Her Pap smear is current, with the last one done in June 2022. Initially, she had an ASCUS Pap with high-risk HPV, but a subsequent colposcopy was normal. The Pap was resent and reread as normal with negative HPV. She is on routine follow-up with the next Pap due in 2027. Her colon cancer screening with Cologuard was negative, and the next one is due in 2026. She is due for a mammogram in July.  She is currently taking Prozac  40 mg for mood and anxiety, which she feels is necessary and is stable on this dose. She is also on famvir  for outbreaks, taking two pills a day, which she finds effective.  Chronic lumbar radiculopathy: She experiences back and hip pain, which is currently well-managed. She has previously undergone physical therapy and received an injection that helped for a long time. She takes gabapentin , which helps her sleep and manage perimenopausal hot flashes. She has been taking it for about two months and finds it beneficial.  She experiences  perimenopausal hot flashes 'all the time' and uses gabapentin  to help with nighttime sleep.  She describes having outbreaks on her face, stomach, and arms, which she attributes to stress and possibly working outside. She mentions scratching the affected areas, which exacerbates the condition. Red itching rash on forearms and stomach and pustules on face  She experiences her hands and fingers falling asleep, particularly at night and when driving. She notes that her hands fall asleep if she drives for long periods with her hands in certain positions.   Assessment  1. Encounter for well adult exam with abnormal findings   2. History of colposcopy   3. Chronic right-sided lumbar radiculopathy   4. GAD (generalized anxiety disorder)   5. Perennial allergic rhinitis   6. Recurrent genital herpes   7. Major depression, chronic   8. Menopausal vasomotor syndrome   9. Rosacea   10. Flexural eczema   11. Bilateral carpal tunnel syndrome      Plan  Female Wellness Visit: Age appropriate Health Maintenance and Prevention measures were discussed with patient. Included topics are cancer screening recommendations, ways to keep healthy (see AVS) including dietary and exercise recommendations, regular eye and dental care, use of seat belts, and avoidance of moderate alcohol use and tobacco use.  Screenings are current.  She will schedule mammogram that is due next month.  Cologuard due next year.  Pap smear due in 2027 BMI: discussed patient's BMI and encouraged positive lifestyle modifications to help get to  or maintain a target BMI. HM needs and immunizations were addressed and ordered. See below for orders. See HM and immunization section for updates.  Up-to-date Routine labs and screening tests ordered including cmp, cbc and lipids where appropriate. Discussed recommendations regarding Vit D and calcium supplementation (see AVS)  Chronic disease management visit and/or acute problem  visit: Assessment and Plan Assessment & Plan Carpal Tunnel Syndrome Symptoms suggest median nerve compression. - Recommend wrist splints at night. - Advise anti-inflammatory medications for 2 weeks. - Instruct to avoid wrist flexion while driving.  Chronic Back and Hip Pain/lumbar radiculopathy: Well-controlled currently Pain managed with gabapentin  and physical therapy. Gabapentin  aids sleep and perimenopausal symptoms. - Continue gabapentin . - Encourage core and back strengthening exercises.  Perimenopausal Symptoms Hot flashes managed with gabapentin .  300 mg nightly - Continue gabapentin  for nighttime hot flash management.  Anxiety and Depression Mood and anxiety well-controlled with Prozac . Sensitive to stressors. - Continue Prozac  40 mg daily. - Adjust treatment if symptoms worsen.  Herpes Simplex Virus (HSV) Recurrent outbreaks managed with Famvir . - Continue suppressive therapy with Famvir  as prescribed.  Eczema and Rosacea Dermatitis possibly exacerbated by scratching and stress. Rosacea-like symptoms present. - Prescribe steroid cream triamcinolone, for dermatitis, apply 1-2 times daily for 1-2 weeks. - Prescribe topical antibiotic for rosacea,  MetroCream twice daily   General Health Maintenance Routine health maintenance up to date except for mammogram and eye exam. - Schedule mammogram for July. - Schedule annual eye exam.  Follow-up Routine follow-up and monitoring of chronic conditions needed. - Schedule follow-up appointment in one year. - Order basic lab work.   Follow up: 12 months for complete physical and follow-up chronic problems Orders Placed This Encounter  Procedures   HM PAP SMEAR   CBC with Differential/Platelet   Comprehensive metabolic panel with GFR   Lipid panel   TSH   Meds ordered this encounter  Medications   triamcinolone cream (KENALOG) 0.1 %    Sig: Apply 1 Application topically 2 (two) times daily for 2 weeks, then as  needed    Dispense:  45 g    Refill:  0   metroNIDAZOLE (METROCREAM) 0.75 % cream    Sig: Apply topically 2 (two) times daily to face for rosacea    Dispense:  45 g    Refill:  3      Body mass index is 23.65 kg/m. Wt Readings from Last 3 Encounters:  12/31/23 137 lb 12.8 oz (62.5 kg)  04/11/23 132 lb (59.9 kg)  04/09/23 132 lb 6.4 oz (60.1 kg)     Patient Active Problem List   Diagnosis Date Noted Date Diagnosed   Chronic right-sided lumbar radiculopathy 12/16/2022     Priority: High    [facility], md 2024  Symptomatic L5-S1 by MRI 2024; mild central canal stenosis Gabapentin  and PT IMPRESSION: (10/2022) 1. Mild spinal canal stenosis at L4-L5 due to combination of disc bulge and facet arthrosis. 2. L5-S1 right lateral recess stenosis which may be a source of right S1 radiculopathy. 3. Small medially projecting synovial cyst from the right L5-S1 facet joint measuring 5 x 2 mm.    Anaphylactic reaction to bee sting 12/12/2021     Priority: High    Epi pen    GAD (generalized anxiety disorder) 05/01/2007     Priority: High    prozac      Major depression, chronic 05/01/2007     Priority: High    Depression - Dr. Pegge Bow.Originally dx'd 2000,  situational.  LT stressors from  childhood.    Menopausal vasomotor syndrome 12/31/2023     Priority: Medium     Gabapentin  qhs    History of colposcopy 12/31/2023     Priority: Low    ASCUS pap w/ HPV 2022 with normal colposcopy done; pap was resent and reread as normal and hpv negative. Returned to routine screening.     Rosacea 12/31/2023     Priority: Low   Flexural eczema 12/31/2023     Priority: Low   Perennial allergic rhinitis 05/01/2007     Priority: Low   Recurrent genital herpes 05/01/2007     Priority: Low   Health Maintenance  Topic Date Due   COVID-19 Vaccine (3 - 2024-25 season) 01/16/2024 (Originally 03/30/2023)   DTaP/Tdap/Td (2 - Td or Tdap) 01/21/2024   MAMMOGRAM  02/06/2024    INFLUENZA VACCINE  02/27/2024   Fecal DNA (Cologuard)  11/30/2024   Cervical Cancer Screening (HPV/Pap Cotest)  01/10/2026   Hepatitis C Screening  Completed   HIV Screening  Completed   Zoster Vaccines- Shingrix   Completed   HPV VACCINES  Aged Out   Meningococcal B Vaccine  Aged Out   Immunization History  Administered Date(s) Administered   Influenza, Seasonal, Injecte, Preservative Fre 04/22/2014   PFIZER(Purple Top)SARS-COV-2 Vaccination 11/11/2019, 12/02/2019   Tdap 01/20/2014   Zoster Recombinant(Shingrix ) 12/12/2021, 02/14/2022   We updated and reviewed the patient's past history in detail and it is documented below. Allergies: Patient is allergic to bee venom, sulfa antibiotics, and sulfonamide derivatives. 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 Past Surgical History Patient  has a past surgical history that includes Appendectomy. Family History: Patient family history includes Cancer in her father. Social History:  Patient  reports that she has never smoked. She does not have any smokeless tobacco history on file. She reports that she does not use drugs.  Review of Systems: Constitutional: negative for fever or malaise Ophthalmic: negative for photophobia, double vision or loss of vision Cardiovascular: negative for chest pain, dyspnea on exertion, or new LE swelling Respiratory: negative for SOB or persistent cough Gastrointestinal: negative for abdominal pain, change in bowel habits or melena Genitourinary: negative for dysuria or gross hematuria, no abnormal uterine bleeding or disharge Musculoskeletal: negative for new gait disturbance or muscular weakness Integumentary: negative for new or persistent rashes, no breast lumps Neurological: negative for TIA or stroke symptoms Psychiatric: negative for SI or delusions Allergic/Immunologic:  negative for hives  Patient Care Team    Relationship Specialty Notifications Start End  Luevenia Saha, MD PCP - General Family Medicine  08/28/22     Objective  Vitals: BP 117/65   Pulse (!) 57   Temp 98.3 F (36.8 C)   Ht 5\' 4"  (1.626 m)   Wt 137 lb 12.8 oz (62.5 kg)   SpO2 94%   BMI 23.65 kg/m  General:  Well developed, well nourished, no acute distress, appears well Psych:  Alert and orientedx3,normal mood and affect HEENT:  Normocephalic, atraumatic, non-icteric sclera,  supple neck without adenopathy, mass or thyromegaly Cardiovascular:  Normal S1, S2, RRR without gallop, rub or murmur Respiratory:  Good breath sounds bilaterally, CTAB with normal respiratory effort Gastrointestinal: normal bowel sounds, soft, non-tender, no noted masses. No HSM MSK: extremities without edema, joints without erythema or swelling Skin: Papules and small pustules on face, bilateral forearms with eczematous changes, flaking and  papules present, no urticaria, no vesicles Neurologic:    Mental status is normal.  Gross motor and sensory exams are normal.  No tremor  Commons side effects, risks, benefits, and alternatives for medications and treatment plan prescribed today were discussed, and the patient expressed understanding of the given instructions. Patient is instructed to call or message via MyChart if he/she has any questions or concerns regarding our treatment plan. No barriers to understanding were identified. We discussed Red Flag symptoms and signs in detail. Patient expressed understanding regarding what to do in case of urgent or emergency type symptoms.  Medication list was reconciled, printed and provided to the patient in AVS. Patient instructions and summary information was reviewed with the patient as documented in the AVS. This note was prepared with assistance of Dragon voice recognition software. Occasional wrong-word or sound-a-like substitutions may have occurred due to the  inherent limitations of voice recognition software

## 2024-01-01 LAB — CBC WITH DIFFERENTIAL/PLATELET
Basophils Absolute: 0 10*3/uL (ref 0.0–0.1)
Basophils Relative: 0.8 % (ref 0.0–3.0)
Eosinophils Absolute: 0.1 10*3/uL (ref 0.0–0.7)
Eosinophils Relative: 1.3 % (ref 0.0–5.0)
HCT: 39.3 % (ref 36.0–46.0)
Hemoglobin: 12.9 g/dL (ref 12.0–15.0)
Lymphocytes Relative: 28.7 % (ref 12.0–46.0)
Lymphs Abs: 1.3 10*3/uL (ref 0.7–4.0)
MCHC: 32.7 g/dL (ref 30.0–36.0)
MCV: 90.4 fl (ref 78.0–100.0)
Monocytes Absolute: 0.3 10*3/uL (ref 0.1–1.0)
Monocytes Relative: 6.4 % (ref 3.0–12.0)
Neutro Abs: 2.9 10*3/uL (ref 1.4–7.7)
Neutrophils Relative %: 62.8 % (ref 43.0–77.0)
Platelets: 201 10*3/uL (ref 150.0–400.0)
RBC: 4.35 Mil/uL (ref 3.87–5.11)
RDW: 14 % (ref 11.5–15.5)
WBC: 4.6 10*3/uL (ref 4.0–10.5)

## 2024-01-01 LAB — LIPID PANEL
Cholesterol: 152 mg/dL (ref 0–200)
HDL: 57 mg/dL (ref >39.00)
LDL Cholesterol: 85 mg/dL (ref 0–99)
NonHDL: 94.59
Total CHOL/HDL Ratio: 3
Triglycerides: 47 mg/dL (ref 0.0–149.0)
VLDL: 9.4 mg/dL (ref 0.0–40.0)

## 2024-01-01 LAB — COMPREHENSIVE METABOLIC PANEL WITH GFR
ALT: 18 U/L (ref 0–35)
AST: 19 U/L (ref 0–37)
Albumin: 4.3 g/dL (ref 3.5–5.2)
Alkaline Phosphatase: 49 U/L (ref 39–117)
BUN: 21 mg/dL (ref 6–23)
CO2: 24 meq/L (ref 19–32)
Calcium: 9.6 mg/dL (ref 8.4–10.5)
Chloride: 105 meq/L (ref 96–112)
Creatinine, Ser: 0.75 mg/dL (ref 0.40–1.20)
GFR: 87.31 mL/min (ref >60.00)
Glucose, Bld: 79 mg/dL (ref 70–99)
Potassium: 4.1 meq/L (ref 3.5–5.1)
Sodium: 141 meq/L (ref 135–145)
Total Bilirubin: 0.3 mg/dL (ref 0.2–1.2)
Total Protein: 6.6 g/dL (ref 6.0–8.3)

## 2024-01-01 LAB — TSH: TSH: 0.69 u[IU]/mL (ref 0.35–5.50)

## 2024-01-02 ENCOUNTER — Other Ambulatory Visit (HOSPITAL_COMMUNITY): Payer: Self-pay

## 2024-01-04 ENCOUNTER — Ambulatory Visit: Payer: Self-pay | Admitting: Family Medicine

## 2024-01-04 NOTE — Progress Notes (Signed)
 Labs reviewed.  The 10-year ASCVD risk score (Arnett DK, et al., 2019) is: 1.9%   Values used to calculate the score:     Age: 58 years     Sex: Female     Is Non-Hispanic African American: No     Diabetic: No     Tobacco smoker: No     Systolic Blood Pressure: 117 mmHg     Is BP treated: No     HDL Cholesterol: 57 mg/dL     Total Cholesterol: 152 mg/dL

## 2024-01-19 ENCOUNTER — Other Ambulatory Visit: Payer: Self-pay

## 2024-02-13 ENCOUNTER — Other Ambulatory Visit: Payer: Self-pay | Admitting: Medical Genetics

## 2024-02-17 ENCOUNTER — Other Ambulatory Visit

## 2024-03-18 ENCOUNTER — Other Ambulatory Visit: Payer: Self-pay | Admitting: Family

## 2024-03-21 ENCOUNTER — Other Ambulatory Visit: Payer: Self-pay | Admitting: Family

## 2024-03-22 ENCOUNTER — Other Ambulatory Visit: Payer: Self-pay

## 2024-03-25 ENCOUNTER — Other Ambulatory Visit: Payer: Self-pay | Admitting: Family

## 2024-03-25 ENCOUNTER — Other Ambulatory Visit (HOSPITAL_COMMUNITY): Payer: Self-pay

## 2024-03-25 ENCOUNTER — Other Ambulatory Visit

## 2024-03-25 DIAGNOSIS — Z006 Encounter for examination for normal comparison and control in clinical research program: Secondary | ICD-10-CM

## 2024-03-27 ENCOUNTER — Encounter (HOSPITAL_COMMUNITY): Payer: Self-pay

## 2024-03-27 ENCOUNTER — Other Ambulatory Visit (HOSPITAL_COMMUNITY): Payer: Self-pay

## 2024-03-31 ENCOUNTER — Other Ambulatory Visit: Payer: Self-pay | Admitting: Family

## 2024-03-31 NOTE — Telephone Encounter (Unsigned)
 Copied from CRM #[id number]. Topic: Clinical - Medication Refill >> Mar 31, 2024  9:05 AM Zy'onna H wrote: Medication:  FLUoxetine  (PROZAC ) 40 MG capsule  gabapentin  (NEURONTIN ) 300 MG capsule  **Patient stated she requested via MyChart a prescription refill 03/27/2024 and hadn't heard anything back, the patient is now completely out of medication.  ** She spoke with her pharmacy and they stated to her they can no longer fill the Rx without new orders from PCP   Has the patient contacted their pharmacy? Yes (Agent: If no, request that the patient contact the pharmacy for the refill. If patient does not wish to contact the pharmacy document the reason why and proceed with request.) (Agent: If yes, when and what did the pharmacy advise?)  This is the patient's preferred pharmacy:   [place] [facility] [address] [place] [address] Phone: [phone] Fax: [phone]  Is this the correct pharmacy for this prescription? Yes If no, delete pharmacy and type the correct one.   Has the prescription been filled recently? Yes 02/23/2024  Is the patient out of the medication? Yes  Has the patient been seen for an appointment in the last year OR does the patient have an upcoming appointment? Yes  Can we respond through MyChart? Yes  Agent: Please be advised that Rx refills may take up to 3 business days. We ask that you follow-up with your pharmacy.

## 2024-03-31 NOTE — Telephone Encounter (Signed)
 Second request

## 2024-04-01 ENCOUNTER — Other Ambulatory Visit (HOSPITAL_COMMUNITY): Payer: Self-pay

## 2024-04-02 LAB — GENECONNECT MOLECULAR SCREEN: Genetic Analysis Overall Interpretation: NEGATIVE

## 2024-04-02 MED ORDER — GABAPENTIN 300 MG PO CAPS: 300.0000 mg | ORAL_CAPSULE | Freq: Every day | ORAL | 3 refills | Status: AC

## 2024-04-02 MED ORDER — FLUOXETINE HCL 40 MG PO CAPS: 40.0000 mg | ORAL_CAPSULE | Freq: Every day | ORAL | 3 refills | Status: AC

## 2024-04-22 ENCOUNTER — Encounter: Payer: Self-pay | Admitting: Pharmacist

## 2024-04-22 ENCOUNTER — Encounter (HOSPITAL_COMMUNITY): Payer: Self-pay

## 2024-04-22 ENCOUNTER — Other Ambulatory Visit (HOSPITAL_COMMUNITY): Payer: Self-pay

## 2024-04-22 ENCOUNTER — Other Ambulatory Visit: Payer: Self-pay

## 2024-04-27 ENCOUNTER — Other Ambulatory Visit (HOSPITAL_COMMUNITY): Payer: Self-pay

## 2024-04-27 ENCOUNTER — Other Ambulatory Visit: Payer: Self-pay

## 2024-08-03 ENCOUNTER — Telehealth: Admitting: Physician Assistant

## 2024-08-03 DIAGNOSIS — J069 Acute upper respiratory infection, unspecified: Secondary | ICD-10-CM

## 2024-08-03 DIAGNOSIS — B9689 Other specified bacterial agents as the cause of diseases classified elsewhere: Secondary | ICD-10-CM

## 2024-08-03 MED ORDER — BENZONATATE 100 MG PO CAPS: 100.0000 mg | ORAL_CAPSULE | Freq: Three times a day (TID) | ORAL | 0 refills | Status: AC | PRN

## 2024-08-03 MED ORDER — AZITHROMYCIN 250 MG PO TABS: ORAL_TABLET | ORAL | 0 refills | Status: AC

## 2024-08-03 NOTE — Progress Notes (Signed)
 We are sorry that you are not feeling well.  Here is how we plan to help!  Based on your presentation I believe you most likely have A cough due to bacteria.  When patients have a fever and a productive cough with a change in color or increased sputum production, we are concerned about bacterial bronchitis.  If left untreated it can progress to pneumonia.  If your symptoms do not improve with your treatment plan it is important that you contact your provider.   I have prescribed Azithromyin 250 mg: two tablets now and then one tablet daily for 4 additonal days    In addition you may use A prescription cough medication called Tessalon  Perles 100mg . You may take 1-2 capsules every 8 hours as needed for your cough.  From your responses in the eVisit questionnaire you describe inflammation in the upper respiratory tract which is causing a significant cough.  This is commonly called Bronchitis and has four common causes:   Allergies Viral Infections Acid Reflux Bacterial Infection Allergies, viruses and acid reflux are treated by controlling symptoms or eliminating the cause. An example might be a cough caused by taking certain blood pressure medications. You stop the cough by changing the medication. Another example might be a cough caused by acid reflux. Controlling the reflux helps control the cough.  USE OF BRONCHODILATOR (RESCUE) INHALERS: There is a risk from using your bronchodilator too frequently.  The risk is that over-reliance on a medication which only relaxes the muscles surrounding the breathing tubes can reduce the effectiveness of medications prescribed to reduce swelling and congestion of the tubes themselves.  Although you feel brief relief from the bronchodilator inhaler, your asthma may actually be worsening with the tubes becoming more swollen and filled with mucus.  This can delay other crucial treatments, such as oral steroid medications. If you need to use a bronchodilator inhaler  daily, several times per day, you should discuss this with your provider.  There are probably better treatments that could be used to keep your asthma under control.     HOME CARE Only take medications as instructed by your medical team. Complete the entire course of an antibiotic. Drink plenty of fluids and get plenty of rest. Avoid close contacts especially the very young and the elderly Cover your mouth if you cough or cough into your sleeve. Always remember to wash your hands A steam or ultrasonic humidifier can help congestion.   GET HELP RIGHT AWAY IF: You develop worsening fever. You become short of breath You cough up blood. Your symptoms persist after you have completed your treatment plan MAKE SURE YOU  Understand these instructions. Will watch your condition. Will get help right away if you are not doing well or get worse.  Your e-visit answers were reviewed by a board certified advanced clinical practitioner to complete your personal care plan.  Depending on the condition, your plan could have included both over the counter or prescription medications. If there is a problem please reply  once you have received a response from your provider. Your safety is important to us .  If you have drug allergies check your prescription carefully.    You can use MyChart to ask questions about today's visit, request a non-urgent call back, or ask for a work or school excuse for 24 hours related to this e-Visit. If it has been greater than 24 hours you will need to follow up with your provider, or enter a new e-Visit to  address those concerns. You will get an e-mail in the next two days asking about your experience.  I hope that your e-visit has been valuable and will speed your recovery. Thank you for using e-visits.   I have spent 5 minutes in review of e-visit questionnaire, review and updating patient chart, medical decision making and response to patient.   Elsie Velma Lunger,  PA-C

## 2024-08-03 NOTE — Progress Notes (Signed)
 Message sent to patient requesting further input regarding current symptoms. Awaiting patient response.

## 2024-12-31 ENCOUNTER — Encounter: Admitting: Family Medicine
